# Patient Record
Sex: Male | Born: 1955 | Race: White | Hispanic: No | Marital: Married | Smoking: Never smoker
Health system: Southern US, Community
[De-identification: ages and names within clinical notes are randomized; demographics above are authoritative.]

## PROBLEM LIST (undated history)

## (undated) DIAGNOSIS — K2970 Gastritis, unspecified, without bleeding: Secondary | ICD-10-CM

## (undated) DIAGNOSIS — K219 Gastro-esophageal reflux disease without esophagitis: Secondary | ICD-10-CM

## (undated) DIAGNOSIS — C4491 Basal cell carcinoma of skin, unspecified: Secondary | ICD-10-CM

## (undated) DIAGNOSIS — N182 Chronic kidney disease, stage 2 (mild): Secondary | ICD-10-CM

## (undated) DIAGNOSIS — R945 Abnormal results of liver function studies: Secondary | ICD-10-CM

## (undated) DIAGNOSIS — R7989 Other specified abnormal findings of blood chemistry: Secondary | ICD-10-CM

## (undated) DIAGNOSIS — R030 Elevated blood-pressure reading, without diagnosis of hypertension: Secondary | ICD-10-CM

## (undated) DIAGNOSIS — E785 Hyperlipidemia, unspecified: Secondary | ICD-10-CM

## (undated) DIAGNOSIS — H9190 Unspecified hearing loss, unspecified ear: Secondary | ICD-10-CM

## (undated) HISTORY — DX: Abnormal results of liver function studies: R94.5

## (undated) HISTORY — DX: Elevated blood-pressure reading, without diagnosis of hypertension: R03.0

## (undated) HISTORY — DX: Basal cell carcinoma of skin, unspecified: C44.91

## (undated) HISTORY — DX: Gastro-esophageal reflux disease without esophagitis: K21.9

## (undated) HISTORY — DX: Unspecified hearing loss, unspecified ear: H91.90

## (undated) HISTORY — DX: Chronic kidney disease, stage 2 (mild): N18.2

## (undated) HISTORY — DX: Other specified abnormal findings of blood chemistry: R79.89

## (undated) HISTORY — DX: Hyperlipidemia, unspecified: E78.5

## (undated) HISTORY — PX: CYST REMOVAL TRUNK: SHX6283

## (undated) HISTORY — DX: Gastritis, unspecified, without bleeding: K29.70

---

## 2008-01-07 ENCOUNTER — Ambulatory Visit (HOSPITAL_COMMUNITY): Admission: RE | Admit: 2008-01-07 | Discharge: 2008-01-07 | Payer: Self-pay | Admitting: Cardiology

## 2013-05-27 ENCOUNTER — Ambulatory Visit: Payer: Self-pay | Admitting: Sports Medicine

## 2013-06-07 ENCOUNTER — Ambulatory Visit (INDEPENDENT_AMBULATORY_CARE_PROVIDER_SITE_OTHER): Payer: Managed Care, Other (non HMO) | Admitting: Sports Medicine

## 2013-06-07 ENCOUNTER — Encounter: Payer: Self-pay | Admitting: Sports Medicine

## 2013-06-07 VITALS — BP 163/104 | Ht 73.0 in | Wt 178.0 lb

## 2013-06-07 DIAGNOSIS — M6789 Other specified disorders of synovium and tendon, multiple sites: Secondary | ICD-10-CM

## 2013-06-07 DIAGNOSIS — M7660 Achilles tendinitis, unspecified leg: Secondary | ICD-10-CM | POA: Insufficient documentation

## 2013-06-07 MED ORDER — NITROGLYCERIN 0.2 MG/HR TD PT24: MEDICATED_PATCH | TRANSDERMAL | Status: DC

## 2013-06-07 NOTE — Progress Notes (Signed)
Subjective:   CC: New patient here to discuss right achilles pain.  HPI:   Patient is an 58 y.o. male with no significant PMH here to discuss right achilles pain. He is a runner and pain started suddenly while running ~2 years ago. He denies trauma or other inciting event, besides switching to minimalist shoes just prior. He rested for 3-4 weeks and resumed running, with subsequent return of pain. Pain was aggravated by running on hard surface. He denies swelling, redness, fevers, or chills. He has worked with a Physiological scientist doing calf raises. He also switched back to shoes with slight heel raise and that initially helped some. He currently reports intermittent dull achy pain while running, worse with going up hills, running on hard surfaces, and first thing in the morning going up/down stairs with stiffness. Has previously tried NTG quarter patch which helped with some of the pain.  Review of Systems - Per HPI.  PMH: No significant PMH Meds: Aspirin 81mg , fish oil  FH: Heart disease in multiple family members  SH: Runner    Objective:  Physical Exam Ht 6\' 1"  (1.854 m)  Wt 178 lb (80.74 kg)  BMI 23.49 kg/m2 GEN: NAD HEENT: Atraumatic, normocephalic, neck supple, EOMI, sclera clear  PULM: normal effort SKIN: No rash or cyanosis; warm and well-perfused EXTR: No lower extremity edema or calf tenderness; Right achilles tendon midsubstance thickening and tenderness to palpation. Mild decreased dorsiflexion compared to left. No joint swelling/effusion or erythema. Ultrasound long and short views performed;  Thickening in midsubstance of achilles tendon 0.73 cm. Hypoechoic changes along tendon consistent with fluid. No tear visualized. Normal gait. PSYCH: Mood and affect euthymic, normal rate and volume of speech NEURO: Awake, alert, no focal deficits grossly, normal speech  Assessment:     Daniel Griffith is a 58 y.o. male here to discuss right achilles pain.    Plan:      Right achilles pain By history and exam, most consistent with right achilles tendonopathy with tenderness and thickening of midsubstance of achilles tendon. On [place], no tear seen. No signs of infection. - Nitroglycerin protocol reviewed with patient. - Eccentric exercises prescribed and reviewed with patient. - Heel lift provided and recommended avoiding running on hard or irregular surfaces. Use pain as guide. - Return in 6 weeks for follow up and repeat US.   Hilton Sinclair, MD [place]

## 2013-06-07 NOTE — Patient Instructions (Signed)

## 2013-06-07 NOTE — Assessment & Plan Note (Addendum)
By history and exam, most consistent with right achilles tendonopathy with tenderness and thickening of midsubstance of achilles tendon. On [place], no tear seen. No signs of infection. - Nitroglycerin protocol reviewed with patient. - Eccentric exercises prescribed and reviewed with patient. - Heel lift provided and recommended avoiding running on hard or irregular surfaces. Use pain as guide. - Return in 6 weeks for follow up and repeat US.

## 2013-07-19 ENCOUNTER — Ambulatory Visit: Payer: Managed Care, Other (non HMO) | Admitting: Sports Medicine

## 2013-08-16 ENCOUNTER — Ambulatory Visit (INDEPENDENT_AMBULATORY_CARE_PROVIDER_SITE_OTHER): Payer: Managed Care, Other (non HMO) | Admitting: Sports Medicine

## 2013-08-16 ENCOUNTER — Encounter: Payer: Self-pay | Admitting: Sports Medicine

## 2013-08-16 ENCOUNTER — Ambulatory Visit
Admission: RE | Admit: 2013-08-16 | Discharge: 2013-08-16 | Disposition: A | Discharge status: Home / Self Care | Payer: Managed Care, Other (non HMO) | Source: Ambulatory Visit | Attending: Sports Medicine | Admitting: Sports Medicine

## 2013-08-16 VITALS — BP 139/90 | Ht 73.0 in | Wt 178.0 lb

## 2013-08-16 DIAGNOSIS — M25512 Pain in left shoulder: Secondary | ICD-10-CM

## 2013-08-16 DIAGNOSIS — M25519 Pain in unspecified shoulder: Secondary | ICD-10-CM

## 2013-08-16 NOTE — Progress Notes (Signed)
   Subjective:    Patient ID: Daniel Griffith, male    DOB: [birth year], 58 y.o.   MRN: [id number]  HPI Mr. Robson presents to clinic with left shoulder pain. His shoulder pain began about 3 months ago at the end of April. He was in a [employer] race, fell, and landed on the anterior part of his left shoulder. A week after the fall, he began noticing some stiffness and pain with movement of the left shoulder. The pain has gotten progressively worse, with pain waking him up at night when he turns and lays on the shoulder. He also had to modify his normal work out regimen since he has pain and/or popping sensation with certain movements (e.g. Overhead weight lifting or pushups). He has not had any past shoulder injuries or surgeries. He has not taken any medications for the pain. He denies neck pain. No numbness or tingling.   Review of Systems     Objective:   Physical Exam Gen: well-dressed, well nourished man sitting comfortably on exam table MSK: shoulders symmetric, no visible deformities or skin changes. Mild pain to palpation over anterior left humeral head. Decreased range of motion with abduction past 90 degrees on left side, otherwise all other range of motion are intact. Shoulder strength is intact bilaterally. Negative O'Brien's on left. Mildly positive empty can. Neurovascularly intact distally.     Assessment & Plan:  **Left shoulder pain: likely rotator cuff involvement - plain film at [facility] imaging - follow up 1 week to review x-rays and perform left shoulder U/S - if imaging shows mild rotator cuff injury, consider steroid injection and cuff exercises. If imaging shows a more involved cuff injury, will consider MRI.  Written by: Ivan Anchors, MS4

## 2013-08-24 ENCOUNTER — Other Ambulatory Visit: Payer: Managed Care, Other (non HMO) | Admitting: Sports Medicine

## 2013-08-24 ENCOUNTER — Encounter: Payer: Self-pay | Admitting: Sports Medicine

## 2013-08-24 ENCOUNTER — Ambulatory Visit (INDEPENDENT_AMBULATORY_CARE_PROVIDER_SITE_OTHER): Payer: Managed Care, Other (non HMO) | Admitting: Sports Medicine

## 2013-08-24 ENCOUNTER — Ambulatory Visit: Payer: Managed Care, Other (non HMO) | Admitting: Sports Medicine

## 2013-08-24 VITALS — BP 146/94 | HR 59 | Ht 73.0 in | Wt 178.0 lb

## 2013-08-24 DIAGNOSIS — M19212 Secondary osteoarthritis, left shoulder: Secondary | ICD-10-CM

## 2013-08-24 DIAGNOSIS — M19219 Secondary osteoarthritis, unspecified shoulder: Secondary | ICD-10-CM

## 2013-08-24 NOTE — Progress Notes (Signed)
   Subjective:    Patient ID: Daniel Griffith, male    DOB: September 02, 1955, 58 y.o.   MRN: [id number]  HPI Patient comes in today for an ultrasound of his left shoulder. Please see his last office note for details regarding history for this left shoulder injury. He continues to have persistent lateral shoulder pain particularly with reaching away from his body or reaching overhead. Still getting pain at night with sleeping on his left shoulder as well. Symptoms all began after he suffered an injury during a mud run.    Review of Systems     Objective:   Physical Exam Well-developed, well-nourished. No acute distress  Left shoulder: Full forward flexion. Abduction is to 160. Positive painful ARC. Pain with internal rotation. Positive empty can, positive Hawkins. Supraspinatus testing is 4/5 compared to 5/5 on the right. Resisted external rotation is 5/5 as is resisted internal rotation. No tenderness over the bicipital groove. Negative O'Briens. No tenderness over the a.c. joint. Neurovascularly intact distally.  X-rays of his left shoulder from 08/16/2013 are reviewed. Nothing acute is seen. Mild arthritic changes at the a.c. Joint  MSK ultrasound left shoulder was performed today. Biceps tendon was well visualized and appears to be within normal limits. Subscapularis and infraspinatus tendons also appear to be within normal limits. There is an area of hypoechoic change in the anterior supraspinatus tendon which is concerning for possible tear here. A.c. joint shows only mild degenerative changes.       Assessment & Plan:  Persistent left shoulder pain worrisome for rotator cuff   Given the traumatic nature of his injury, physical exam findings, and worrisome findings on ultrasound I want to get an MRI scan of his left shoulder to rule out a potential full-thickness rotator cuff tear which may require surgery. I will followup with him via telephone with those results once available at which  point we will delineate further treatment.

## 2013-08-28 ENCOUNTER — Ambulatory Visit
Admission: RE | Admit: 2013-08-28 | Discharge: 2013-08-28 | Disposition: A | Discharge status: Home / Self Care | Payer: Managed Care, Other (non HMO) | Source: Ambulatory Visit | Attending: Sports Medicine | Admitting: Sports Medicine

## 2013-08-28 DIAGNOSIS — M19212 Secondary osteoarthritis, left shoulder: Secondary | ICD-10-CM

## 2013-09-01 ENCOUNTER — Other Ambulatory Visit: Payer: Self-pay | Admitting: *Deleted

## 2013-09-01 ENCOUNTER — Telehealth: Payer: Self-pay | Admitting: Sports Medicine

## 2013-09-01 DIAGNOSIS — M25512 Pain in left shoulder: Secondary | ICD-10-CM

## 2013-09-01 NOTE — Telephone Encounter (Signed)
I spoke with the patient on the phone today regarding MRI findings of his left shoulder. The area of hypoechoic change in the supraspinatus tendon seen on his ultrasound corresponds with some tendinopathy seen on the MRI. In addition to this, he also has findings consistent with adhesive capsulitis as well as some mild glenohumeral degenerative changes. Based on these findings I have recommended a referral to [facility] imaging for interventional radiology to perform a combination glenohumeral/subacromial cortisone injection. Patient will followup with me in 3-4 weeks after the injection for education in a Jobe home exercise program. Patient is in agreement with this plan.

## 2013-09-02 ENCOUNTER — Other Ambulatory Visit: Payer: Self-pay | Admitting: Sports Medicine

## 2013-09-02 DIAGNOSIS — M25512 Pain in left shoulder: Secondary | ICD-10-CM

## 2013-09-14 ENCOUNTER — Ambulatory Visit
Admission: RE | Admit: 2013-09-14 | Discharge: 2013-09-14 | Disposition: A | Discharge status: Home / Self Care | Payer: Managed Care, Other (non HMO) | Source: Ambulatory Visit | Attending: Sports Medicine | Admitting: Sports Medicine

## 2013-09-14 DIAGNOSIS — M25512 Pain in left shoulder: Secondary | ICD-10-CM

## 2013-09-14 MED ORDER — METHYLPREDNISOLONE ACETATE 40 MG/ML INJ SUSP (RADIOLOG
120.0000 mg | Freq: Once | INTRAMUSCULAR | Status: AC
  Administered 2013-09-14: 120 mg via INTRA_ARTICULAR

## 2013-09-14 MED ORDER — IOHEXOL 180 MG/ML  SOLN
1.0000 mL | Freq: Once | INTRAMUSCULAR | Status: AC | PRN
  Administered 2013-09-14: 1 mL via INTRA_ARTICULAR

## 2013-09-17 ENCOUNTER — Other Ambulatory Visit: Payer: Self-pay | Admitting: Sports Medicine

## 2013-09-17 DIAGNOSIS — M25512 Pain in left shoulder: Secondary | ICD-10-CM

## 2013-11-03 ENCOUNTER — Ambulatory Visit
Admission: RE | Admit: 2013-11-03 | Discharge: 2013-11-03 | Disposition: A | Discharge status: Home / Self Care | Payer: Managed Care, Other (non HMO) | Source: Ambulatory Visit | Attending: Sports Medicine | Admitting: Sports Medicine

## 2013-11-03 ENCOUNTER — Encounter: Payer: Self-pay | Admitting: Sports Medicine

## 2013-11-03 ENCOUNTER — Ambulatory Visit (INDEPENDENT_AMBULATORY_CARE_PROVIDER_SITE_OTHER): Payer: Managed Care, Other (non HMO) | Admitting: Sports Medicine

## 2013-11-03 VITALS — BP 152/96 | Ht 73.0 in | Wt 180.0 lb

## 2013-11-03 DIAGNOSIS — M5442 Lumbago with sciatica, left side: Secondary | ICD-10-CM

## 2013-11-03 MED ORDER — PREDNISONE 50 MG PO TABS: 50.0000 mg | ORAL_TABLET | Freq: Every day | ORAL | Status: DC

## 2013-11-03 NOTE — Progress Notes (Signed)
  Daniel Griffith - 58 y.o. male MRN [id number]  Date of birth: 04-Sep-1955  CC & HPI:  The patient presents for evaluation of: Mid low back pain: Patient reports 8 weeks of mid low back pain that occasionally radiates down the posterior aspect of his left leg into his calf. Never to his toes. He reports this began following lifting weights and did better for 1-2 weeks and flared up again after resuming normal activity. His pain is centrally located and radiates down the left buttock. It is worse with prolonged standing or sitting. It is better with activity and he was asymptomatic on a 30 minute run most recently. His pain level is rated at 2 or 3 out of 10. He has been taking anti-inflammatories intermittently and gets some relief with this but not significant. He does have nighttime awakenings that began over the past 2-3 weeks. Denies fevers, chills, changes in bowel or bladder and denies saddle anesthesia.  ROS:  Per HPI.   HISTORY: Past Medical, Surgical, Social, and Family History Reviewed & Updated per EMR.  Pertinent Historical Findings include: Otherwise relatively healthy. No chronic medications. Prior shoulder issues are improved. Works in [employer].   OBJECTIVE FINDINGS:  VS:   HT:6\' 1"  (185.4 cm)   WT:180 lb (81.647 kg)  BMI:23.8          BP:152/96 mmHg  HR: bpm  TEMP: ( )  RESP:   PHYSICAL EXAM: GENERAL:  adult Caucasian athletic male. In no discomfort; no respiratory distress   PSYCH: alert and appropriate, good insight   NEURO: Sensation is intact to light touch in bilateral lower extremity dermatomes. Lower extremity DTRs 1+ out of 4 diffusely   VASCULAR:  bilateral PT pulses 2+/4.  No significant edema.   Back EXAM: Appearance:  overall normal-appearing, no significant scoliosis. Neutral leg length at rest. With sitting pseudo-short left leg.   Skin: No overlying erythema/ecchymosis.  Palpation: TTP over: Left gluteals minimally  No TTP over: Bilateral  greater trochanters, left piriformis.   Strength & ROM: 5/5 Strength and full active ROM in: Bilateral lower extremity myotomes  He is able to heel and toe walk without difficulty  Special Tests:  positive left straight leg raise radiating to the lateral malleolus. Slightly positive contralateral straight leg raise.  Neutral ASIS and open SI joints to ASIS compression test.    ASSESSMENT: 1. 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 - Lumbar spine X-ray to eval for structural etiology given >8 weeks. - Prednisone burst - HEP: back handout to progress as tolerated > Return in about 6 weeks (around 12/15/2013), or if symptoms worsen or fail to improve.

## 2013-11-17 ENCOUNTER — Telehealth: Payer: Self-pay | Admitting: Sports Medicine

## 2013-11-17 NOTE — Telephone Encounter (Signed)
I spoke with the patient today. He is continuing to suffer with left leg radiculopathy despite a recent steroid dose pack. Therefore I will have him work with physical therapist Barbaraann Barthel and he will let me know if symptoms persist afterwards. If so, I would consider an MRI of his lumbar spine to evaluate degree of lumbar disc pathology present.

## 2014-06-02 ENCOUNTER — Encounter: Payer: Self-pay | Admitting: Sports Medicine

## 2014-06-02 ENCOUNTER — Ambulatory Visit (INDEPENDENT_AMBULATORY_CARE_PROVIDER_SITE_OTHER): Payer: Managed Care, Other (non HMO) | Admitting: Sports Medicine

## 2014-06-02 VITALS — BP 151/93 | Ht 73.0 in | Wt 175.0 lb

## 2014-06-02 DIAGNOSIS — M67879 Other specified disorders of synovium and tendon, unspecified ankle and foot: Secondary | ICD-10-CM

## 2014-06-02 DIAGNOSIS — M7660 Achilles tendinitis, unspecified leg: Secondary | ICD-10-CM

## 2014-06-02 MED ORDER — NITROGLYCERIN 0.2 MG/HR TD PT24: MEDICATED_PATCH | TRANSDERMAL | Status: DC

## 2014-06-02 NOTE — Progress Notes (Signed)
  Daniel Griffith - 59 y.o. male MRN [id number]  Date of birth: 09-09-1955  SUBJECTIVE: Including CC, HPI, ROS HISTORY:  Chief complaint: left Achilles pain, reevaluation  HPI: patient reports greater than 6 months of worsening left Achilles pain.  He is previously been seen for this and prescribed therapeutic exercises to the short course of nitroglycerin a tolerated well but does not feel significant benefit.  He did have some minor side effects retoucher dispatch to have to patch.  Reports pain did somewhat improve it has significantly come back and is once again affecting his ability to run and participate in daily activities.  GQB:[id number] fevers, chills, recent weight gain or weight loss.  No significant nighttime awakenings due to this issue.  No night sweats.  No specialty comments available. Social History   Occupational History  . Not on file.   Social History Main Topics  . Smoking status: Never Smoker   . Smokeless tobacco: Not on file  . Alcohol Use: Not on file  . Drug Use: Not on file  . Sexual Activity: Not on file      Problem  Achilles Tendon Pain   History of right Achilles issues as well as left sciatic pain. Question contribution of potential neurogenic component contributing to prolonged healing. Was performing concentric/bounding with no significant eccentric component for therapeutic exercise     OBJECTIVE: HT:6\' 1"  (185.4 cm) WT:175 lb (79.379 kg) BMI:23.1 BP:(!) 151/93 mmHg HR: bpm TEMP: ( ) RESP:  PHYSICAL EXAM: GENERAL: adult Caucasian male. No acute distress PSYCH: Alert and appropriately interactive. SKIN: No open skin lesions or abnormal skin markings on areas inspected as below VASCULAR: Distal pulse is intact.  No significant extremity edema. NEURO: Lower extremity strength is 5+/5 in all myotomes; sensation is intact to light touch in all dermatomes. RIGHT ANKLE: overall well line, no significant malalignment.  He does have a significant  prominence of the right greater than left Achilles upon palpation.  This is not fluctuate.  No surrounding erythema.  Marked tenderness over the area of swelling.  No significant archer calcaneal swelling.  No significant insertional heel pain with either the Achilles or the plantar fascia.  Dorsiflexion to 105.  Bilaterally.  Ankle is stable. Marland Kitchen    DATA REVIEWED & OBTAINED:  Limited MSK [place] right ankle: findings markedly thick Achilles tendon with marked increased neovascularity measuring aproximately 0.9 cm.  Left is measuring 0.6 to 0.7 cm although there's no increased neovascularity there. 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

## 2014-06-02 NOTE — Assessment & Plan Note (Addendum)
1. Reemphasized importance of appropriate Alfredson exercises. Focus on eccentric phase. 2. Body Helix right ankle compression sleeve provided today. Discussed using during activity as well as for the first 2 hours following activity and PRN. 3. Ice PRN 4. Avoid ballistic activities, discussed potential for tendon rupture

## 2014-06-02 NOTE — Patient Instructions (Signed)

## 2014-10-26 ENCOUNTER — Ambulatory Visit (INDEPENDENT_AMBULATORY_CARE_PROVIDER_SITE_OTHER): Payer: Managed Care, Other (non HMO) | Admitting: Family Medicine

## 2014-10-26 ENCOUNTER — Encounter: Payer: Self-pay | Admitting: Family Medicine

## 2014-10-26 VITALS — BP 140/66 | HR 57 | Ht 73.0 in | Wt 178.0 lb

## 2014-10-26 DIAGNOSIS — S83241A Other tear of medial meniscus, current injury, right knee, initial encounter: Secondary | ICD-10-CM | POA: Diagnosis not present

## 2014-10-26 NOTE — Progress Notes (Signed)
  Daniel Griffith - 59 y.o. male MRN [id number]  Date of birth: 1955/12/14  SUBJECTIVE:  Including CC & ROS.  Daniel Griffith is a 59 y.o. male who presents today for right knee pain.    Knee Pain right - patient presents today for 5 days of right knee pain. He states that he was in the batting cage at that time at a slight twisting injury when he had some immediate pain in the right knee. Located in the inner aspect and states that it had a little bit of swelling the next day when he woke up. This gradually improved but is worse when he has deep flexion or with running. Has tried ice and ibuprofen which has helped somewhat. No previous injury to the left of the right knee.  Past medical family social history reviewed and updated.  12 point ROS negative other than per HPI.   Exam:  Filed Vitals:   10/26/14 1409  BP: 140/66  Pulse: 57    Gen: NAD Cardiorespiratory - Normal respiratory effort/rate.  RRR R Knee:  Normal to inspection with no erythema or effusion or obvious bony abnormalities.  No obvious Baker's cysts Slight tenderness to palpation along the medial joint line ROM normal in flexion (135 degrees) and extension (0 degrees) and lower leg rotation. Ligaments with solid consistent endpoints including ACL, PCL, LCL, MCL.  Negative Anterior Drawer/Lachman/Pivot Shift + Mcmurray's and provocative meniscal tests including Thessaly and Apley compression testing  Non painful patellar compression.  Normal Patellar glide.  No apprehension  Patellar and quadriceps tendons unremarkable. Hamstring and quadriceps strength is normal.  Neurovascularly intact B/L LE   Imaging:  None today

## 2014-10-26 NOTE — Assessment & Plan Note (Signed)
Patient with classic description of meniscus tear of the medial meniscus of the right knee. -Minimal swelling, pain only with deep flexion, pain only at terminal McMurray's, and gradual swelling initially. -Conservative management is what he would like to try first with ice, decreasing flexion exercises/squats, ibuprofen, quad and hamstring strengthening and avoiding aggravating activities. -If it is still bugging him in 3-4 weeks he will call which point we will consider injection versus imaging including knee x-rays/MRI.

## 2015-06-30 ENCOUNTER — Other Ambulatory Visit: Payer: Self-pay | Admitting: Family Medicine

## 2015-06-30 DIAGNOSIS — R740 Nonspecific elevation of levels of transaminase and lactic acid dehydrogenase [LDH]: Principal | ICD-10-CM

## 2015-06-30 DIAGNOSIS — R7401 Elevation of levels of liver transaminase levels: Secondary | ICD-10-CM

## 2015-06-30 DIAGNOSIS — R1084 Generalized abdominal pain: Secondary | ICD-10-CM

## 2015-07-07 ENCOUNTER — Ambulatory Visit
Admission: RE | Admit: 2015-07-07 | Discharge: 2015-07-07 | Disposition: A | Discharge status: Home / Self Care | Payer: Managed Care, Other (non HMO) | Source: Ambulatory Visit | Attending: Family Medicine | Admitting: Family Medicine

## 2015-07-07 DIAGNOSIS — R740 Nonspecific elevation of levels of transaminase and lactic acid dehydrogenase [LDH]: Principal | ICD-10-CM

## 2015-07-07 DIAGNOSIS — R1084 Generalized abdominal pain: Secondary | ICD-10-CM

## 2015-07-07 DIAGNOSIS — R7401 Elevation of levels of liver transaminase levels: Secondary | ICD-10-CM

## 2015-07-07 MED ORDER — IOPAMIDOL (ISOVUE-300) INJECTION 61%
100.0000 mL | Freq: Once | INTRAVENOUS | Status: AC | PRN
  Administered 2015-07-07: 100 mL via INTRAVENOUS

## 2015-09-21 ENCOUNTER — Ambulatory Visit
Admission: RE | Admit: 2015-09-21 | Discharge: 2015-09-21 | Disposition: A | Discharge status: Home / Self Care | Payer: Managed Care, Other (non HMO) | Source: Ambulatory Visit | Attending: Physician Assistant | Admitting: Physician Assistant

## 2015-09-21 ENCOUNTER — Other Ambulatory Visit: Payer: Self-pay | Admitting: Physician Assistant

## 2015-09-21 DIAGNOSIS — L989 Disorder of the skin and subcutaneous tissue, unspecified: Secondary | ICD-10-CM

## 2015-09-27 ENCOUNTER — Other Ambulatory Visit: Payer: Self-pay | Admitting: Physician Assistant

## 2015-09-28 ENCOUNTER — Other Ambulatory Visit: Payer: Self-pay | Admitting: Physician Assistant

## 2015-09-28 ENCOUNTER — Other Ambulatory Visit: Payer: Self-pay | Admitting: General Surgery

## 2015-09-28 DIAGNOSIS — M7989 Other specified soft tissue disorders: Secondary | ICD-10-CM

## 2015-10-05 ENCOUNTER — Other Ambulatory Visit: Payer: Managed Care, Other (non HMO)

## 2015-10-12 ENCOUNTER — Other Ambulatory Visit: Payer: Managed Care, Other (non HMO)

## 2016-07-12 IMAGING — CT CT ABD-PELV W/ CM
1 of 3 series · 14 of 32 positions shown, 19 images · IV contrast (APPLIED)
Comparison: None.

CLINICAL DATA: RIGHT upper quadrant pain extending into the RIGHT
flank.

EXAM:
CT ABDOMEN AND PELVIS WITH CONTRAST
TECHNIQUE: Multidetector CT imaging of the abdomen and pelvis was performed
using the standard protocol following bolus administration of
intravenous contrast.
CONTRAST:  100mL 02DOHP-DXX IOPAMIDOL (02DOHP-DXX) INJECTION 61%

[Series 2: abd/pelvis w/cm · axial · 0.79mm/px · z∈[+602,+1042]mm · 14 of 98 slices shown, 19 images]
[im 5/98  soft-tissue]
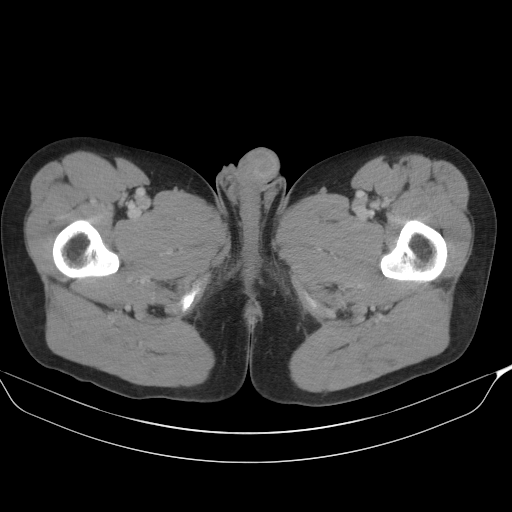
[im 5/98  bone]
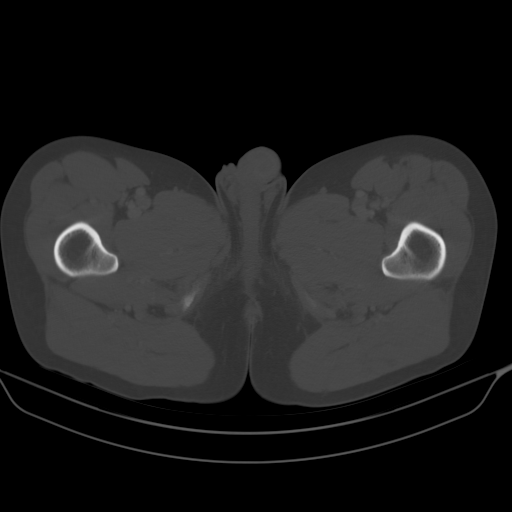
[im 15/98  soft-tissue]
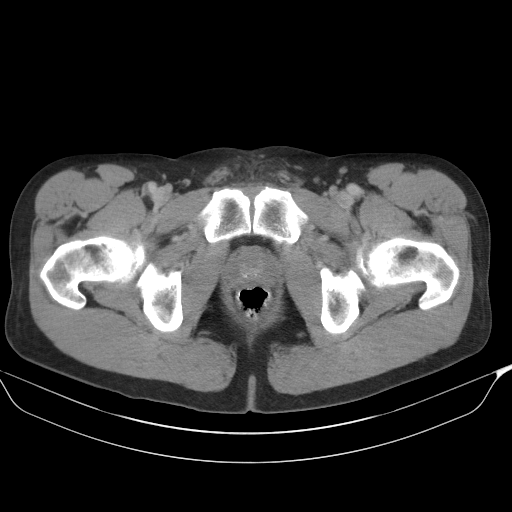
[im 20/98  soft-tissue]
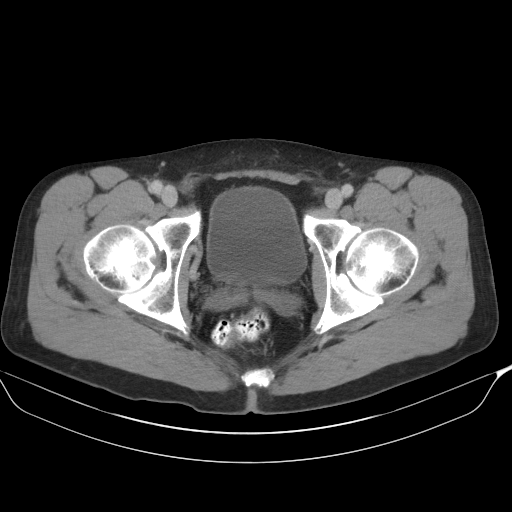
[im 30/98  soft-tissue]
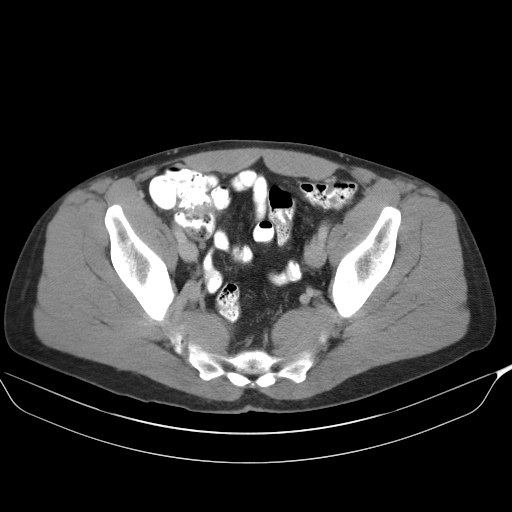
[im 34/98  soft-tissue]
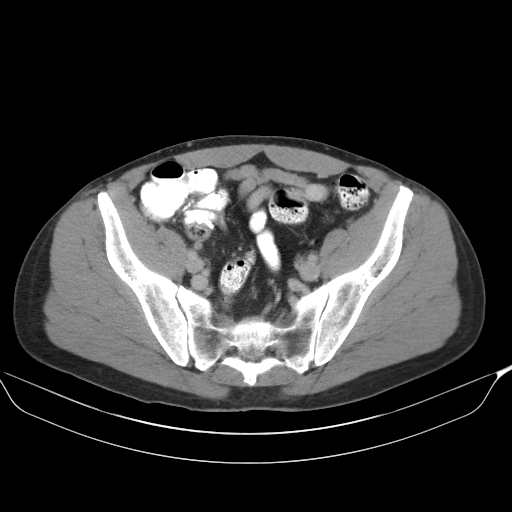
[im 44/98  soft-tissue]
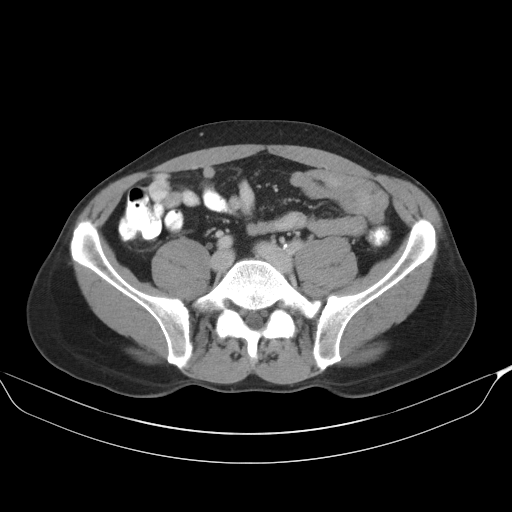
[im 49/98  soft-tissue]
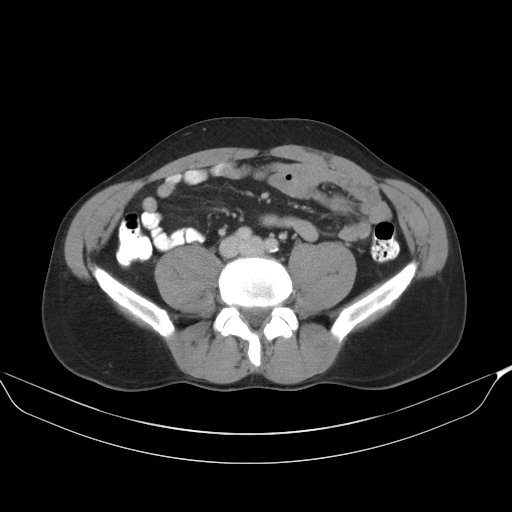
[im 54/98  soft-tissue]
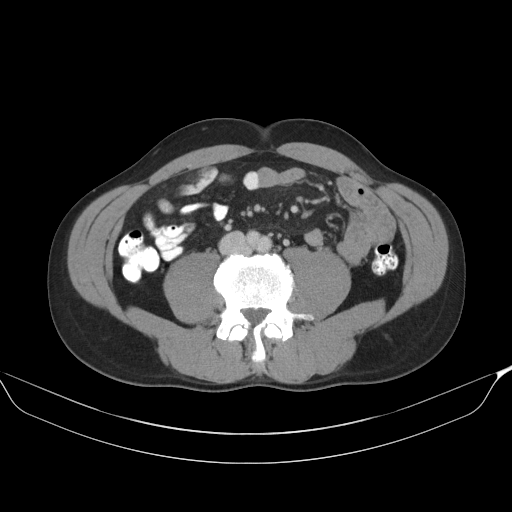
[im 64/98  soft-tissue]
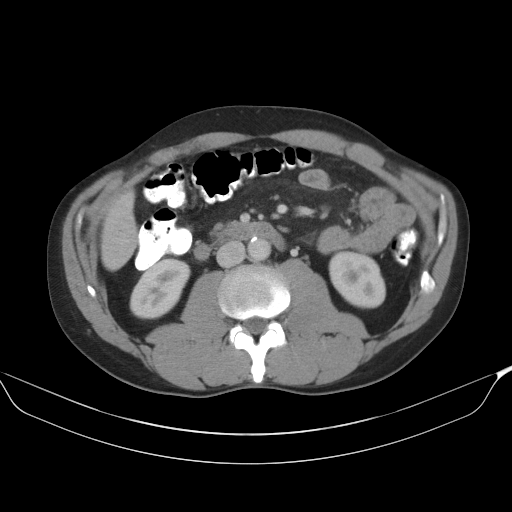
[im 64/98  bone]
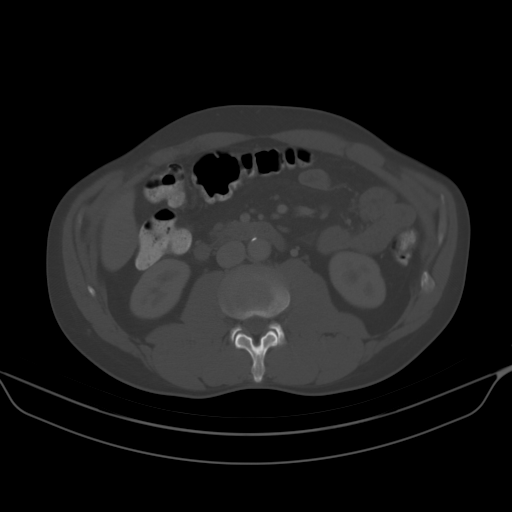
[im 68/98  soft-tissue]
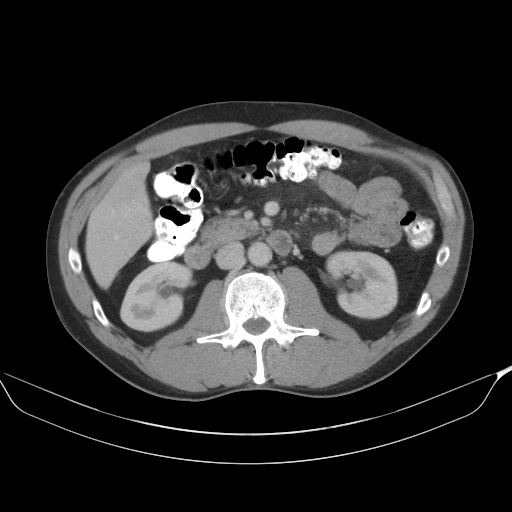
[im 78/98  soft-tissue]
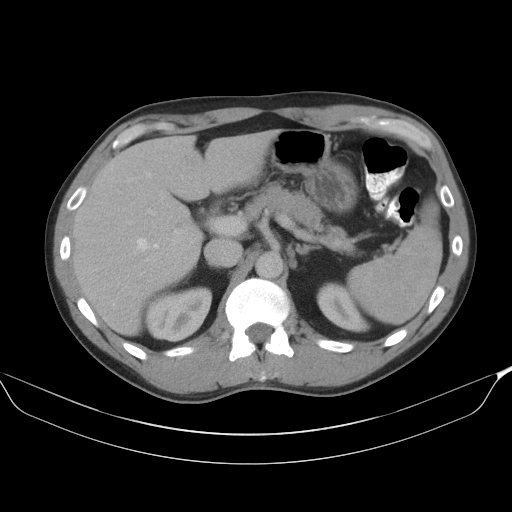
[im 78/98  lung]
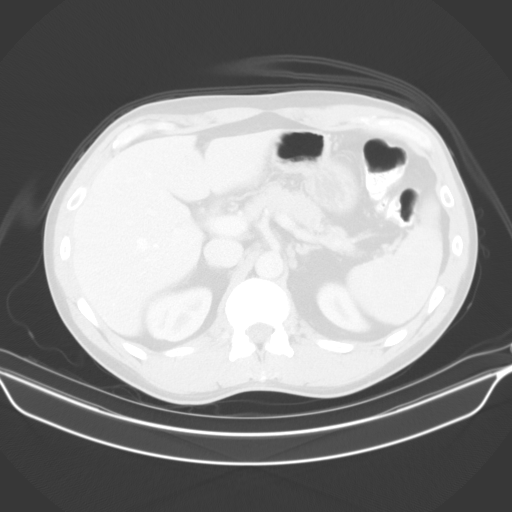
[im 83/98  soft-tissue]
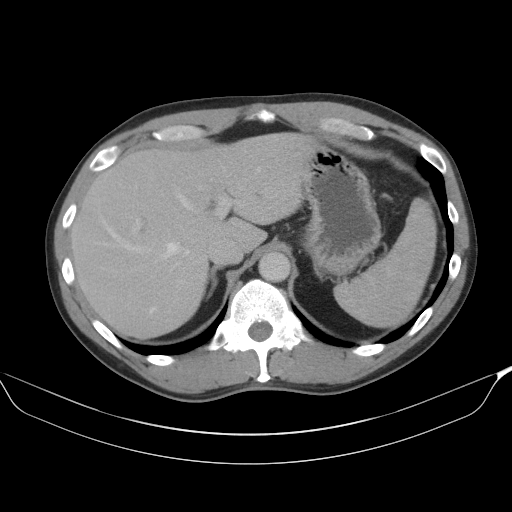
[im 83/98  lung]
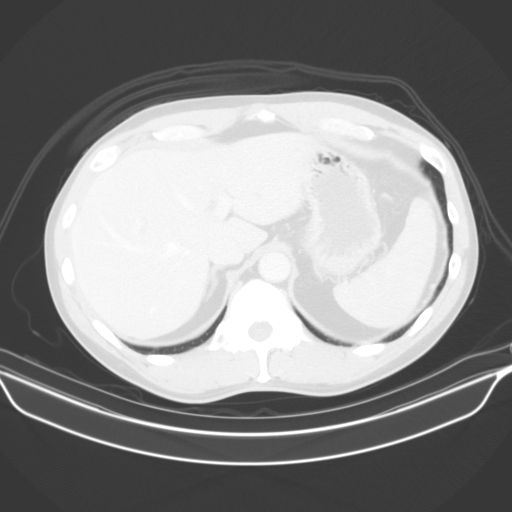
[im 88/98  lung]
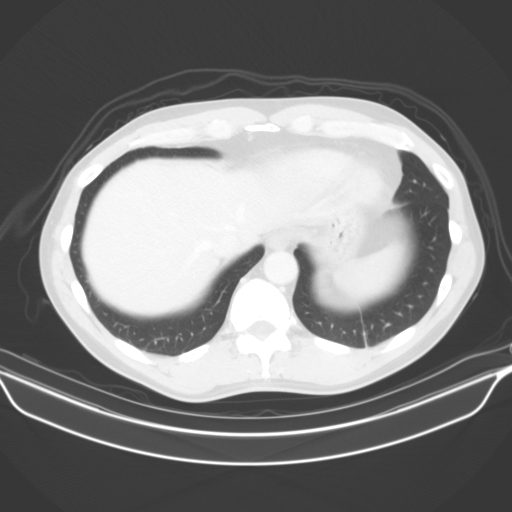
[im 93/98  soft-tissue]
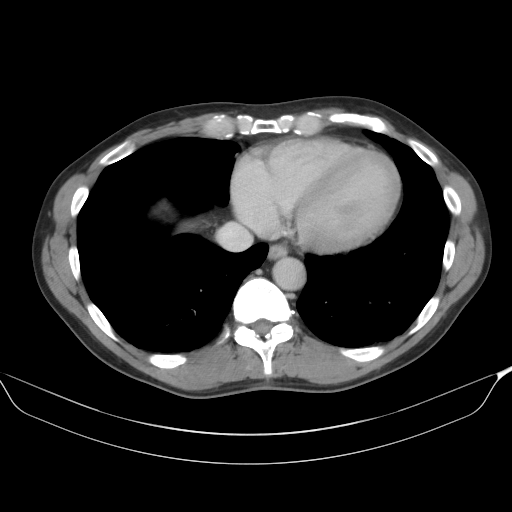
[im 93/98  lung]
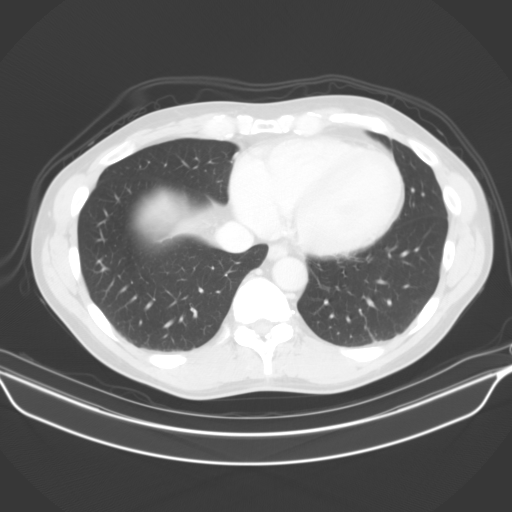

[14 of 32 positions shown; findings below may reference images not displayed]

FINDINGS: Lower chest: Lung bases are clear.

Hepatobiliary: No focal hepatic lesion. No biliary duct dilatation.
Gallbladder is normal. Common bile duct is normal.

Pancreas: Pancreas is normal. No ductal dilatation. No pancreatic
inflammation.

Spleen: Normal spleen

Adrenals/urinary tract: Adrenal glands and kidneys are normal. The
ureters and bladder normal.

Stomach/Bowel: Stomach, small bowel, appendix, and cecum are normal.
The colon and rectosigmoid colon are normal.

Vascular/Lymphatic: Abdominal aorta is normal caliber with
atherosclerotic calcification. There is no retroperitoneal or
periportal lymphadenopathy. No pelvic lymphadenopathy.

Reproductive: Number prostate gland

Other: No free fluid.

Musculoskeletal: No aggressive osseous lesion.
IMPRESSION: 1. No acute findings in the abdomen pelvis. No explanation RIGHT
flank pain.
2. Normal gallbladder and appendix.  No ureteral lithiasis.

## 2017-01-24 ENCOUNTER — Telehealth: Payer: Self-pay

## 2017-01-24 NOTE — Telephone Encounter (Signed)
SENT REFERRAL TO SCHEDULING 

## 2017-03-07 ENCOUNTER — Other Ambulatory Visit: Payer: Self-pay | Admitting: Family Medicine

## 2017-03-07 ENCOUNTER — Ambulatory Visit
Admission: RE | Admit: 2017-03-07 | Discharge: 2017-03-07 | Disposition: A | Discharge status: Home / Self Care | Payer: 59 | Source: Ambulatory Visit | Attending: Family Medicine | Admitting: Family Medicine

## 2017-03-07 DIAGNOSIS — S91302A Unspecified open wound, left foot, initial encounter: Secondary | ICD-10-CM

## 2017-03-10 ENCOUNTER — Other Ambulatory Visit: Payer: Self-pay

## 2017-03-10 DIAGNOSIS — C4491 Basal cell carcinoma of skin, unspecified: Secondary | ICD-10-CM | POA: Insufficient documentation

## 2017-03-17 ENCOUNTER — Ambulatory Visit (INDEPENDENT_AMBULATORY_CARE_PROVIDER_SITE_OTHER): Payer: 59 | Admitting: Cardiology

## 2017-03-17 ENCOUNTER — Encounter: Payer: Self-pay | Admitting: Cardiology

## 2017-03-17 VITALS — BP 142/80 | HR 64 | Ht 73.0 in | Wt 192.8 lb

## 2017-03-17 DIAGNOSIS — R0602 Shortness of breath: Secondary | ICD-10-CM | POA: Diagnosis not present

## 2017-03-17 DIAGNOSIS — I472 Ventricular tachycardia, unspecified: Secondary | ICD-10-CM

## 2017-03-17 DIAGNOSIS — Z8249 Family history of ischemic heart disease and other diseases of the circulatory system: Secondary | ICD-10-CM

## 2017-03-17 NOTE — Progress Notes (Signed)
Cardiology Office Note:    Date:  03/17/2017   ID:  Daniel Griffith, DOB [birth year], MRN [id number]  PCP:  Maury Dus, MD  Cardiologist:  No primary care provider on file.   Referring MD: Maury Dus, MD     History of Present Illness:    Daniel Griffith is a 62 y.o. male here for the evaluation of ventricular tachycardia.  This was discovered on an exercise treadmill test in 2009.  His father had CABG at age 44 brother had cardiac stenting at age 28.  His LDL cholesterol is 145, HDL 69, creatinine 1.  Report from/treadmill test 12/18/2007 shows exercising for 13-1/35minutes.  After 11 minutes of exercise he developed bigeminy.  This persisted and varied between the bigeminy and trigeminy finally at 13 minutes of exercise he developed nonsustained ventricular tachycardia anywhere from 4-7 beats.  In the rest phase this promptly resolved.  No ST segment changes.  No chest pain.  Options were discussed with him and Dr. Ilda Foil at that time did not feel his findings and his risk factors of nonsustained ventricular tachycardia could be disregarded.  He discussed options for noninvasive versus invasive further evaluation including risks of both.  After lengthy discussion he elected to proceed with cardiac catheterization and coronary angiography.  I do not have any records from the cardiac catheterization. This was negative per peatient.   When starting a run going heard or temperature change has increased SOB, slow down.  Inhairler seems to be working. No syncope.   Past Medical History:  Diagnosis Date  . BCC (basal cell carcinoma of skin)    right cheek  . Blood pressure elevated without history of HTN   . Chronic kidney disease, stage 2 (mild)   . Diminished hearing   . Elevated LFTs    no specific diagnosis  . Gastritis   . GERD (gastroesophageal reflux disease)   . Hyperlipidemia     Past Surgical History:  Procedure Laterality Date  . CYST REMOVAL TRUNK     removed from  mid and right anterior neck    Current Medications: Current Meds  Medication Sig  . aspirin 81 MG tablet Take 81 mg by mouth daily.  . clindamycin (CLEOCIN) 300 MG capsule Take 1 capsule by mouth 3 (three) times daily.     Allergies:   Patient has no known allergies.   Social History   Socioeconomic History  . Marital status: Married    Spouse name: None  . Number of children: 2  . Years of education: None  . Highest education level: None  Social Needs  . Financial resource strain: None  . Food insecurity - worry: None  . Food insecurity - inability: None  . Transportation needs - medical: None  . Transportation needs - non-medical: None  Occupational History  . None  Tobacco Use  . Smoking status: Never Smoker  . Smokeless tobacco: Never Used  Substance and Sexual Activity  . Alcohol use: Yes    Comment: drinks a couple of beers on the weekends  . Drug use: No  . Sexual activity: None  Other Topics Concern  . None  Social History Narrative  . None     Family History: The patient's family history includes Alzheimer's disease in his father; Breast cancer in his mother; CVA in his mother; Other in his brother and father; Prostate cancer in his brother; Skin cancer in his father.  ROS:   Please see the history of present illness.  All other systems reviewed and are negative.  EKGs/Labs/Other Studies Reviewed:    The following studies were reviewed today: Prior office notes, exercise treadmill test reviewed as above.  EKG reviewed  EKG:  EKG is  ordered today.  03/17/17-64, sinus rhythm, vertical axis.  Recent Labs: No results found for requested labs within last 8760 hours.  Recent Lipid Panel No results found for: CHOL, TRIG, HDL, CHOLHDL, VLDL, LDLCALC, LDLDIRECT   Total cholesterol 226, HDL 69, LDL 145, triglycerides 62 1, creatinine 1, potassium 5.1, ALT 25, TSH 1.4  Physical Exam:    VS:  BP (!) 142/80   Pulse 64   Ht 6\' 1"  (1.854 m)   Wt 192 lb  12.8 oz (87.5 kg)   BMI 25.44 kg/m     Wt Readings from Last 3 Encounters:  03/17/17 192 lb 12.8 oz (87.5 kg)  10/26/14 178 lb (80.7 kg)  06/02/14 175 lb (79.4 kg)     GEN:  Well nourished, well developed in no acute distress HEENT: Normal NECK: No JVD; No carotid bruits LYMPHATICS: No lymphadenopathy CARDIAC: RRR, no murmurs, rubs, gallops RESPIRATORY:  Clear to auscultation without rales, wheezing or rhonchi  ABDOMEN: Soft, non-tender, non-distended MUSCULOSKELETAL:  No edema; No deformity  SKIN: Warm and dry NEUROLOGIC:  Alert and oriented x 3 PSYCHIATRIC:  Normal affect   ASSESSMENT:    1. Shortness of breath   2. Family history of early CAD   3. Ventricular tachycardia (HCC)    PLAN:    In order of problems listed above:  Dyspnea/history of ventricular tachycardia -I will check an exercise treadmill test to see if we can duplicate his symptoms or previous findings.  10 years ago after treadmill test, he underwent cardiac catheterization and this was negative.  He seemed to have have some relief with new inhaler, perhaps there is some exercise-induced asthma that is present this is encouraging that he is feeling better with the new inhaler.  Let us get another exercise treadmill test to see if there is been any significant changes.  He does have a family history of CAD.  If necessary, we can pursue coronary CT scan to evaluate coronary arteries versus invasive cardiac catheterization.  We will follow-up with results.  I will check an echocardiogram as well to make sure structure and function is normal.   Medication Adjustments/Labs and Tests Ordered: Current medicines are reviewed at length with the patient today.  Concerns regarding medicines are outlined above.  Orders Placed This Encounter  Procedures  . Exercise Tolerance Test  . EKG 12-Lead  . ECHOCARDIOGRAM COMPLETE   No orders of the defined types were placed in this encounter.   Signed, Candee Furbish, MD    03/17/2017 9:42 AM    [facility]

## 2017-03-17 NOTE — Patient Instructions (Signed)
Medication Instructions:  The current medical regimen is effective;  continue present plan and medications.  Testing/Procedures: Your physician has requested that you have an echocardiogram. Echocardiography is a painless test that uses sound waves to create images of your heart. It provides your doctor with information about the size and shape of your heart and how well your heart's chambers and valves are working. This procedure takes approximately one hour. There are no restrictions for this procedure.  Your physician has requested that you have an exercise tolerance test. For further information please visit [email or web address]. Please also follow instruction sheet, as given.  Follow-Up: Follow up as needed after the above testing.   Thank you for choosing [facility]!!

## 2017-04-02 ENCOUNTER — Other Ambulatory Visit: Payer: Self-pay

## 2017-04-02 ENCOUNTER — Ambulatory Visit (HOSPITAL_COMMUNITY): Payer: 59 | Attending: Cardiology

## 2017-04-02 ENCOUNTER — Ambulatory Visit (INDEPENDENT_AMBULATORY_CARE_PROVIDER_SITE_OTHER): Payer: 59

## 2017-04-02 DIAGNOSIS — R0602 Shortness of breath: Secondary | ICD-10-CM

## 2017-04-02 LAB — EXERCISE TOLERANCE TEST
CHL CUP MPHR: 161 {beats}/min
CHL CUP RESTING HR STRESS: 63 {beats}/min
CHL RATE OF PERCEIVED EXERTION: 13
Estimated workload: 10.4 METS
Exercise duration (min): 9 min
Exercise duration (sec): 0 s
Peak HR: 137 {beats}/min
Percent HR: 85 %

## 2017-04-03 ENCOUNTER — Telehealth: Payer: Self-pay | Admitting: Cardiology

## 2017-04-03 NOTE — Telephone Encounter (Signed)
Follow Up:    Returning Pam Fleming's call from yesterday,concerning his Stress Test results.

## 2017-04-03 NOTE — Telephone Encounter (Signed)
Pt called back for ETT results. He verbalized understanding. He did have additional questions about his blood pressure and if he should start something for BP management. He stated he was going to call his PCP regarding this, but would still like for Dr Gust Brooms nurse to give him a call back to discuss this.

## 2017-04-04 ENCOUNTER — Other Ambulatory Visit: Payer: Self-pay | Admitting: *Deleted

## 2017-04-04 DIAGNOSIS — R0602 Shortness of breath: Secondary | ICD-10-CM

## 2017-04-04 DIAGNOSIS — R931 Abnormal findings on diagnostic imaging of heart and coronary circulation: Secondary | ICD-10-CM

## 2017-04-04 NOTE — Telephone Encounter (Signed)
Called and spoke with patient.  He did not have any questions r/t his results.  He does not remember any questions that he had.  He is contacting his PCP in r/t HTN.  He will c/b with any questions/concerns.

## 2017-04-04 NOTE — Progress Notes (Signed)
Notes recorded by Jerline Pain, MD on 04/02/2017 at 3:20 PM EDT Normal EF, mild diastolic dysfunction. Please have him come back in for a bubble study to exclude membranous VSD. I am not convinced that there is a VSD present.  Candee Furbish, MD

## 2017-04-07 ENCOUNTER — Telehealth: Payer: Self-pay | Admitting: Cardiology

## 2017-04-07 NOTE — Telephone Encounter (Signed)
Pt called back to office to question if OK to continue to exercise since he was noted to have hypertensive response to exercise.  Advised per Dr Marlou Porch, ok to continue to exercise.  Also advised he should f/u with his PCP for any necessary treatment r/t this.  He states understanding.  Also discussed the results of his 2 D Echo and need for further testing in the way of a bubble study to r/o possible VSD.  Explained the procedure to the patient and that a scheduler will be calling back to set up the appt.  He states understanding.

## 2017-04-07 NOTE — Telephone Encounter (Signed)
New Message   Patient is calling with additional questions about his stress test. Please call to discuss.

## 2017-04-08 ENCOUNTER — Encounter: Payer: Self-pay | Admitting: Cardiology

## 2017-04-21 ENCOUNTER — Other Ambulatory Visit (HOSPITAL_COMMUNITY): Payer: 59

## 2017-04-23 ENCOUNTER — Ambulatory Visit (HOSPITAL_COMMUNITY): Payer: 59 | Attending: Cardiology

## 2017-04-23 ENCOUNTER — Other Ambulatory Visit: Payer: Self-pay

## 2017-04-23 DIAGNOSIS — R931 Abnormal findings on diagnostic imaging of heart and coronary circulation: Secondary | ICD-10-CM | POA: Diagnosis not present

## 2017-04-23 DIAGNOSIS — R0602 Shortness of breath: Secondary | ICD-10-CM | POA: Diagnosis present

## 2018-03-13 IMAGING — CR DG FOOT 2V*L*
2 series · 2 of 2 positions shown · non-contrast
Comparison: No recent.

CLINICAL DATA: Left foot injury with wound.

EXAM:
LEFT FOOT - 2 VIEW

[x foot ap left]
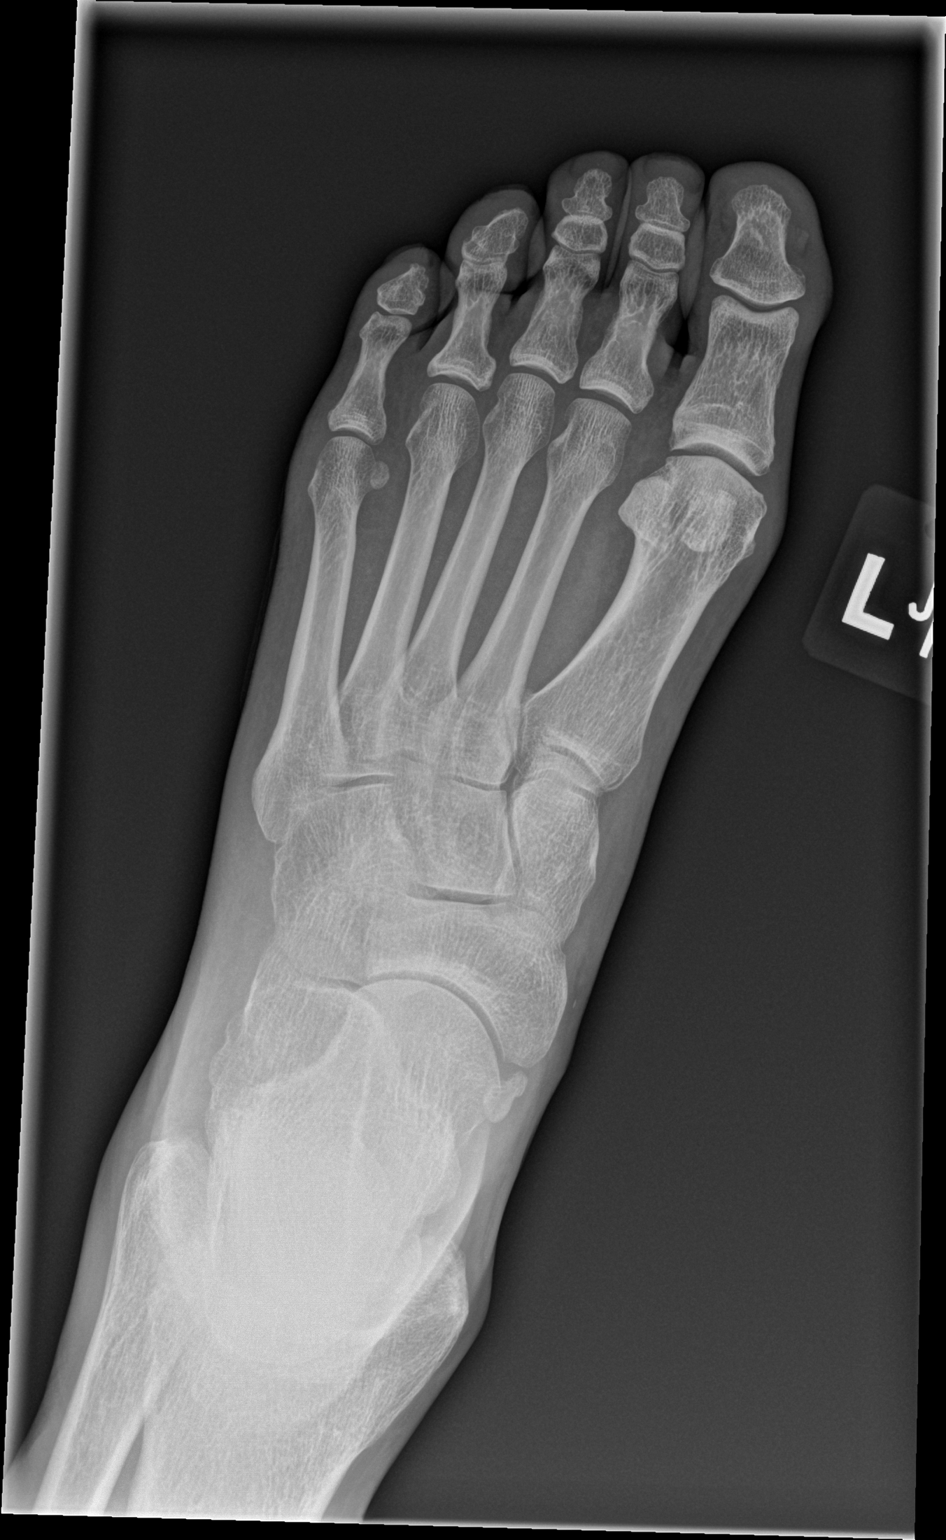

[x foot lat left]
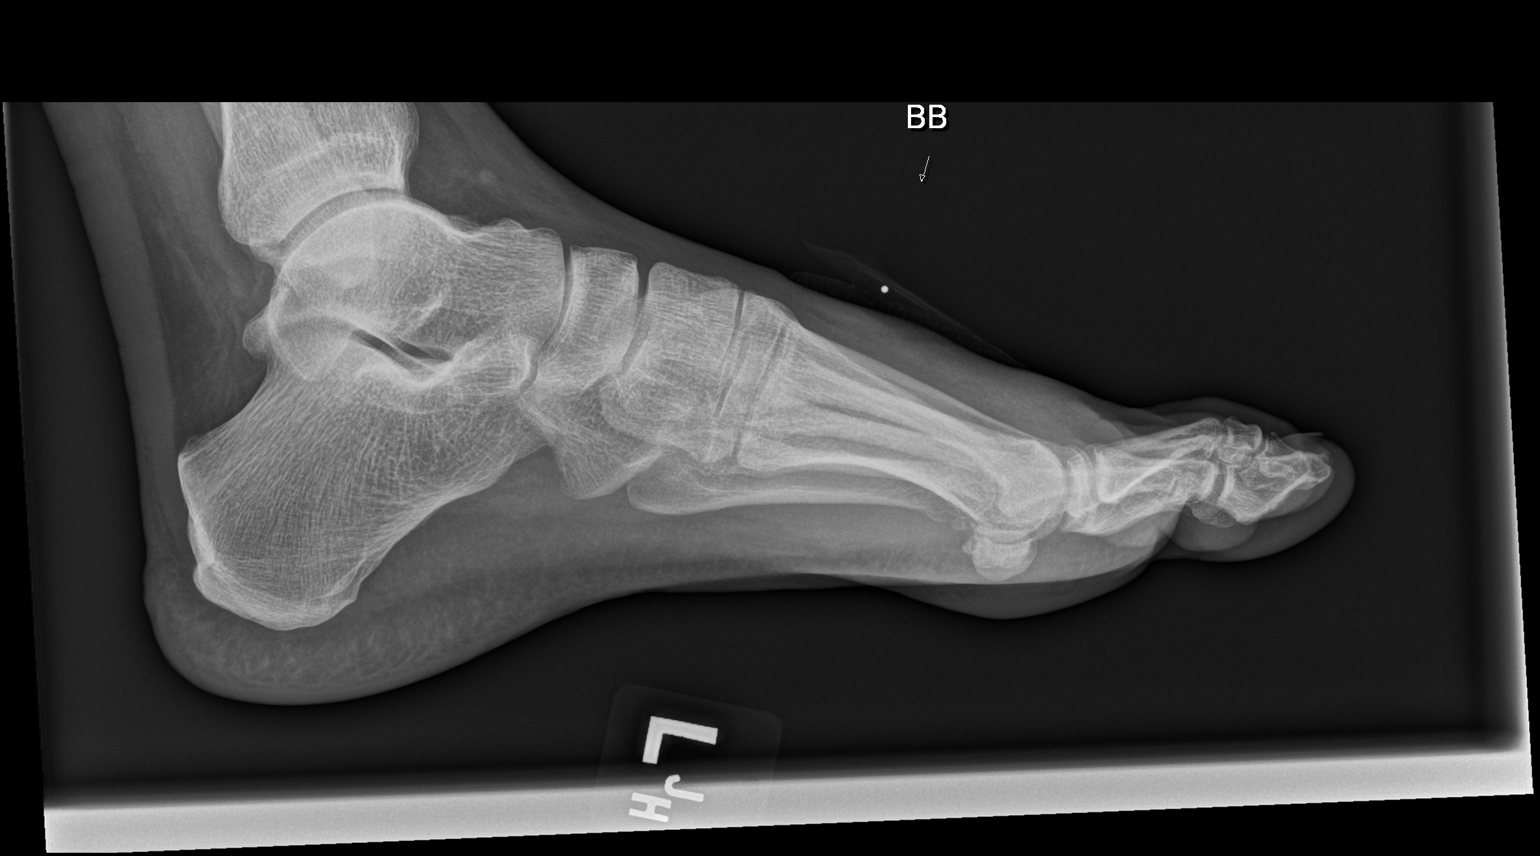

[2 of 2 positions shown; findings below may reference images not displayed]

FINDINGS: Mild soft tissue swelling noted. No acute bony abnormality. Tiny
bony density noted adjacent to the navicula. This most likely
dystrophic calcification.
IMPRESSION: Mild soft tissue swelling. No acute or focal bony abnormality
identified. No evidence of fracture. No evidence of bony erosion..

## 2019-05-18 ENCOUNTER — Ambulatory Visit (INDEPENDENT_AMBULATORY_CARE_PROVIDER_SITE_OTHER): Payer: 59 | Admitting: Pulmonary Disease

## 2019-05-18 DIAGNOSIS — R0600 Dyspnea, unspecified: Secondary | ICD-10-CM

## 2019-05-18 NOTE — Patient Instructions (Signed)
Will check CBC differential, IgE and chest x-ray. Please come in tomorrow to get these done We will also schedule PFTs at next available for further evaluation of her problems of dyspnea with exertion.  Follow up in person in clinic after PFTs for review and plan for next steps

## 2019-05-18 NOTE — Progress Notes (Signed)
Daniel Griffith    [id number]    September 20, 1955  Primary Care Physician:Reade, Herbie [place], MD  Referring Physician: Maury Dus, MD [place],  [place] [address]  Chief complaint: Consult for dyspnea, post Covid  HPI: 64 year old with visit with gastritis, hyperlipidemia Complains of chronic dyspnea on exertion for the past 10 to 12 years.  This occurs mostly at blacking out with chest tightness, unable to take a deep breath. Diagnosed with Covid on 03/17/2019.  He did not require hospitalization.  Post Covid he has numbness, weakness and increase in dyspnea which is more frequent now He continues to stay active with aerobic exercise with a trainer.  Given albuterol inhaler by his primary care which he uses before exercise but does not help with symptoms.  Patient scheduled visit as televisit by mistake for first consult.  Pets: Has a dog Occupation: Management job Exposures: No known exposures.  No mold, hot tub, Jacuzzi.  He has a down comforter Smoking history: Never smoker Travel history: No significant travel history Relevant family history: No significant family history of lung disease   Outpatient Encounter Medications as of 05/18/2019  Medication Sig  . aspirin 81 MG tablet Take 81 mg by mouth daily.   No facility-administered encounter medications on file as of 05/18/2019.    Allergies as of 05/18/2019  . (No Known Allergies)    Past Medical History:  Diagnosis Date  . BCC (basal cell carcinoma of skin)    right cheek  . Blood pressure elevated without history of HTN   . Chronic kidney disease, stage 2 (mild)   . Diminished hearing   . Elevated LFTs    no specific diagnosis  . Gastritis   . GERD (gastroesophageal reflux disease)   . Hyperlipidemia     Past Surgical History:  Procedure Laterality Date  . CYST REMOVAL TRUNK     removed from mid and right anterior neck    Family History  Problem Relation Age of Onset  .  CVA Mother   . Breast cancer Mother   . Alzheimer's disease Father   . Skin cancer Father   . Other Father        CABG at age 45  . Prostate cancer Brother   . Other Brother        Cardiac stenting x 1 at age 68    Social History   Socioeconomic History  . Marital status: Married    Spouse name: Not on file  . Number of children: 2  . Years of education: Not on file  . Highest education level: Not on file  Occupational History  . Not on file  Tobacco Use  . Smoking status: Never Smoker  . Smokeless tobacco: Never Used  Substance and Sexual Activity  . Alcohol use: Yes    Comment: drinks a couple of beers on the weekends  . Drug use: No  . Sexual activity: Not on file  Other Topics Concern  . Not on file  Social History Narrative  . Not on file   Social Determinants of Health   Financial Resource Strain:   . Difficulty of Paying Living Expenses:   Food Insecurity:   . Worried About Charity fundraiser in the Last Year:   . Arboriculturist in the Last Year:   Transportation Needs:   . Film/video editor (Medical):   Marland Kitchen Lack of Transportation (Non-Medical):  Physical Activity:   . Days of Exercise per Week:   . Minutes of Exercise per Session:   Stress:   . Feeling of Stress :   Social Connections:   . Frequency of Communication with Friends and Family:   . Frequency of Social Gatherings with Friends and Family:   . Attends Religious Services:   . Active Member of Clubs or Organizations:   . Attends Archivist Meetings:   Marland Kitchen Marital Status:   Intimate Partner Violence:   . Fear of Current or Ex-Partner:   . Emotionally Abused:   Marland Kitchen Physically Abused:   . Sexually Abused:     Review of systems: Review of Systems  Constitutional: Negative for fever and chills.  HENT: Negative.   Eyes: Negative for blurred vision.  Respiratory: as per HPI  Cardiovascular: Negative for chest pain and palpitations.  Gastrointestinal: Negative for vomiting,  diarrhea, blood per rectum. Genitourinary: Negative for dysuria, urgency, frequency and hematuria.  Musculoskeletal: Negative for myalgias, back pain and joint pain.  Skin: Negative for itching and rash.  Neurological: Negative for dizziness, tremors, focal weakness, seizures and loss of consciousness.  Endo/Heme/Allergies: Negative for environmental allergies.  Psychiatric/Behavioral: Negative for depression, suicidal ideas and hallucinations.  All other systems reviewed and are negative.  Physical Exam: Televisit   Data Reviewed: Imaging: CT abdomen pelvis 07/07/15-visualized lung bases are clear.  I have reviewed the images personally.   Assessment:  Dyspnea on exertion Sounds like exercise-induced bronchospasm. Symptoms worsen after recent Covid infection.  He also has a down comforter at home Hence will need evaluation of any interstitial lung disease  We will check CBC differential, IgE and PFTs Schedule chest x-ray Consider CT scan based on review of these results.  Plan/Recommendations: CBC, IgE, chest x-ray PFTs In person visit after these tests.  Marshell Garfinkel MD [facility] 05/18/2019, 2:22 PM  CC: Maury Dus, MD

## 2019-05-19 ENCOUNTER — Other Ambulatory Visit: Payer: Self-pay

## 2019-05-19 ENCOUNTER — Other Ambulatory Visit (INDEPENDENT_AMBULATORY_CARE_PROVIDER_SITE_OTHER): Payer: 59

## 2019-05-19 ENCOUNTER — Ambulatory Visit (INDEPENDENT_AMBULATORY_CARE_PROVIDER_SITE_OTHER)
Admission: RE | Admit: 2019-05-19 | Discharge: 2019-05-19 | Disposition: A | Discharge status: Home / Self Care | Payer: 59 | Source: Ambulatory Visit | Attending: Pulmonary Disease | Admitting: Pulmonary Disease

## 2019-05-19 DIAGNOSIS — R0609 Other forms of dyspnea: Secondary | ICD-10-CM

## 2019-05-19 DIAGNOSIS — R0600 Dyspnea, unspecified: Secondary | ICD-10-CM

## 2019-05-19 LAB — CBC WITH DIFFERENTIAL/PLATELET
Basophils Absolute: 0.1 10*3/uL (ref 0.0–0.1)
Basophils Relative: 0.9 % (ref 0.0–3.0)
Eosinophils Absolute: 0.3 10*3/uL (ref 0.0–0.7)
Eosinophils Relative: 4.6 % (ref 0.0–5.0)
HCT: 45.7 % (ref 39.0–52.0)
Hemoglobin: 15.9 g/dL (ref 13.0–17.0)
Lymphocytes Relative: 27.4 % (ref 12.0–46.0)
Lymphs Abs: 1.7 10*3/uL (ref 0.7–4.0)
MCHC: 34.8 g/dL (ref 30.0–36.0)
MCV: 86.2 fl (ref 78.0–100.0)
Monocytes Absolute: 0.4 10*3/uL (ref 0.1–1.0)
Monocytes Relative: 6.6 % (ref 3.0–12.0)
Neutro Abs: 3.7 10*3/uL (ref 1.4–7.7)
Neutrophils Relative %: 60.5 % (ref 43.0–77.0)
Platelets: 190 10*3/uL (ref 150.0–400.0)
RBC: 5.3 Mil/uL (ref 4.22–5.81)
RDW: 13.4 % (ref 11.5–15.5)
WBC: 6.1 10*3/uL (ref 4.0–10.5)

## 2019-05-20 LAB — IGE: IgE (Immunoglobulin E), Serum: 41 kU/L (ref <=114)

## 2019-06-18 ENCOUNTER — Other Ambulatory Visit: Payer: Self-pay

## 2019-06-18 ENCOUNTER — Ambulatory Visit (INDEPENDENT_AMBULATORY_CARE_PROVIDER_SITE_OTHER): Payer: No Typology Code available for payment source | Admitting: Pulmonary Disease

## 2019-06-18 DIAGNOSIS — R0600 Dyspnea, unspecified: Secondary | ICD-10-CM | POA: Diagnosis not present

## 2019-06-18 DIAGNOSIS — R0609 Other forms of dyspnea: Secondary | ICD-10-CM

## 2019-06-18 LAB — PULMONARY FUNCTION TEST
DL/VA % pred: 92 %
DL/VA: 3.84 ml/min/mmHg/L
DLCO cor % pred: 95 %
DLCO cor: 28.79 ml/min/mmHg
DLCO unc % pred: 99 %
DLCO unc: 29.79 ml/min/mmHg
FEF 25-75 Post: 3.23 L/sec
FEF 25-75 Pre: 2.41 L/sec
FEF2575-%Change-Post: 34 %
FEF2575-%Pred-Post: 100 %
FEF2575-%Pred-Pre: 74 %
FEV1-%Change-Post: 5 %
FEV1-%Pred-Post: 95 %
FEV1-%Pred-Pre: 90 %
FEV1-Post: 3.78 L
FEV1-Pre: 3.57 L
FEV1FVC-%Change-Post: 5 %
FEV1FVC-%Pred-Pre: 94 %
FEV6-%Change-Post: 0 %
FEV6-%Pred-Post: 99 %
FEV6-%Pred-Pre: 99 %
FEV6-Post: 5 L
FEV6-Pre: 4.98 L
FEV6FVC-%Change-Post: 0 %
FEV6FVC-%Pred-Post: 104 %
FEV6FVC-%Pred-Pre: 104 %
FVC-%Change-Post: 0 %
FVC-%Pred-Post: 95 %
FVC-%Pred-Pre: 94 %
FVC-Post: 5.02 L
FVC-Pre: 4.99 L
Post FEV1/FVC ratio: 75 %
Post FEV6/FVC ratio: 100 %
Pre FEV1/FVC ratio: 72 %
Pre FEV6/FVC Ratio: 100 %
RV % pred: 158 %
RV: 3.84 L
TLC % pred: 116 %
TLC: 8.88 L

## 2019-06-18 NOTE — Progress Notes (Signed)
Full PFT performed today. °

## 2019-07-28 ENCOUNTER — Ambulatory Visit (INDEPENDENT_AMBULATORY_CARE_PROVIDER_SITE_OTHER): Payer: No Typology Code available for payment source | Admitting: Pulmonary Disease

## 2019-07-28 ENCOUNTER — Other Ambulatory Visit: Payer: Self-pay

## 2019-07-28 ENCOUNTER — Encounter: Payer: Self-pay | Admitting: Pulmonary Disease

## 2019-07-28 VITALS — BP 124/62 | HR 62 | Temp 97.6°F | Ht 73.0 in | Wt 187.6 lb

## 2019-07-28 DIAGNOSIS — R0600 Dyspnea, unspecified: Secondary | ICD-10-CM

## 2019-07-28 DIAGNOSIS — R0609 Other forms of dyspnea: Secondary | ICD-10-CM

## 2019-07-28 MED ORDER — BREO ELLIPTA 200-25 MCG/INH IN AEPB: 1.0000 | INHALATION_SPRAY | Freq: Every day | RESPIRATORY_TRACT | 2 refills | Status: DC

## 2019-07-28 MED ORDER — BREO ELLIPTA 100-25 MCG/INH IN AEPB: 1.0000 | INHALATION_SPRAY | Freq: Every day | RESPIRATORY_TRACT | 0 refills | Status: DC

## 2019-07-28 NOTE — Progress Notes (Signed)
         Daniel Griffith    [id number]    April 05, 1955  Primary Care Physician:Reade, Herbie [place], MD  Referring Physician: Maury Dus, MD [place],  [place] [address]  Chief complaint: Follow-up dyspnea, post Covid  HPI: 64 year old with visit with gastritis, hyperlipidemia Complains of chronic dyspnea on exertion for the past 10 to 12 years.  This occurs mostly at blacking out with chest tightness, unable to take a deep breath. Diagnosed with Covid in Dec 2021 and vaccine in march 2021.  He did not require hospitalization.  Post Covid he has numbness, weakness and increase in dyspnea which is more frequent now He continues to stay active with aerobic exercise with a trainer.  Given albuterol inhaler by his primary care which he uses before exercise but does not help with symptoms.  Pets: Has a dog Occupation: Management job Exposures: No known exposures.  No mold, hot tub, Jacuzzi.  He has a down comforter Smoking history: Never smoker Travel history: No significant travel history Relevant family history: No significant family history of lung disease  Interim history: Continues to have dyspnea, chest tightness post exercise. States that albuterol inhaler prior to exercise is not helping  Outpatient Encounter Medications as of 07/28/2019  Medication Sig  . aspirin 81 MG tablet Take 81 mg by mouth daily.  Marland Kitchen olmesartan (BENICAR) 20 MG tablet Take 20 mg by mouth every morning.  . rosuvastatin (CRESTOR) 10 MG tablet Take 10 mg by mouth once a week.   No facility-administered encounter medications on file as of 07/28/2019.   Physical Exam: Blood pressure 124/62, pulse 62, temperature 97.6 F (36.4 C), temperature source Oral, height 6\' 1"  (1.854 m), weight 187 lb 9.6 oz (85.1 kg), SpO2 99 %. Gen:      No acute distress HEENT:  EOMI, sclera anicteric Neck:     No masses; no thyromegaly Lungs:    Clear to auscultation bilaterally; normal respiratory  effort CV:         Regular rate and rhythm; no murmurs Abd:      + bowel sounds; soft, non-tender; no palpable masses, no distension Ext:    No edema; adequate peripheral perfusion Skin:      Warm and dry; no rash Neuro: alert and oriented x 3 Psych: normal mood and affect   Data Reviewed: Imaging: CT abdomen pelvis 07/07/15-visualized lung bases are clear.  I have reviewed the images personally. Chest x-ray 05/19/2019-no acute pulmonary disease.  I have reviewed the images personally.  PFTs: 06/18/2019 FVC 5.02 [95%], FEV1 3.78 [95%], F/F 75, TLC 8.88 [116%] DLCO 29.79 [99%] Minimal obstructive airway disease  Labs: CBC 05/19/2019-WBC 6.1, eos 4.6%, absolute eosinophil count 218 IgE 05/19/2019-41  Assessment:  Dyspnea on exertion Sounds like exercise-induced bronchospasm. Symptoms worsen after recent Covid infection.  He also has a down comforter at home but no evidence of interstitial lung disease PFTs reviewed with normal F/F ratio but improvement in mid flow rates and curvature to the flow loop suggestive of small airways disease Discussed cardiopulmonary exercise test for better evaluation but he had an exercise test in 2009 and most recently a treadmill stress test in 2019 and is not interested in another test  We will start him on Breo and see if it helps Follow-up in 3 months.  Plan/Recommendations: Breo inhaler.  Marshell Garfinkel MD [facility] 07/28/2019, 8:38 AM  CC: Maury Dus, MD

## 2019-07-28 NOTE — Patient Instructions (Addendum)
I have reviewed your labs and lung function test They are mostly normal with very minimal changes suggestive of exercise-induced bronchospasm, reactive airway disease  We will start you on an inhaler called Breo to see if it helps Follow-up in 3 months.

## 2019-11-04 ENCOUNTER — Other Ambulatory Visit: Payer: Self-pay | Admitting: Pulmonary Disease

## 2020-02-20 ENCOUNTER — Other Ambulatory Visit: Payer: Self-pay | Admitting: Pulmonary Disease

## 2022-03-21 ENCOUNTER — Other Ambulatory Visit: Payer: Self-pay | Admitting: Physician Assistant

## 2022-03-21 DIAGNOSIS — E785 Hyperlipidemia, unspecified: Secondary | ICD-10-CM

## 2022-04-02 ENCOUNTER — Ambulatory Visit
Admission: RE | Admit: 2022-04-02 | Discharge: 2022-04-02 | Disposition: A | Discharge status: Home / Self Care | Payer: No Typology Code available for payment source | Source: Ambulatory Visit | Attending: Physician Assistant | Admitting: Physician Assistant

## 2022-04-02 DIAGNOSIS — E785 Hyperlipidemia, unspecified: Secondary | ICD-10-CM

## 2023-02-27 ENCOUNTER — Other Ambulatory Visit (INDEPENDENT_AMBULATORY_CARE_PROVIDER_SITE_OTHER): Payer: Medicare HMO

## 2023-02-27 ENCOUNTER — Ambulatory Visit: Payer: Medicare HMO | Admitting: Sports Medicine

## 2023-02-27 ENCOUNTER — Other Ambulatory Visit: Payer: Self-pay

## 2023-02-27 ENCOUNTER — Encounter: Payer: Self-pay | Admitting: Sports Medicine

## 2023-02-27 DIAGNOSIS — M25572 Pain in left ankle and joints of left foot: Secondary | ICD-10-CM | POA: Diagnosis not present

## 2023-02-27 DIAGNOSIS — M6788 Other specified disorders of synovium and tendon, other site: Secondary | ICD-10-CM | POA: Diagnosis not present

## 2023-02-27 DIAGNOSIS — M79662 Pain in left lower leg: Secondary | ICD-10-CM

## 2023-02-27 DIAGNOSIS — M19031 Primary osteoarthritis, right wrist: Secondary | ICD-10-CM

## 2023-02-27 NOTE — Progress Notes (Signed)
 Patient says that about 1 and a half years ago he began having pain and noticing a "bulge" in his left achilles. He says that he retired 6 months ago and stopped going to the gym at that time as well. He has been limping on the leg since his pain began, although says that he has been back in the gym for the last 6 days using the treadmill, elliptical, and bike, and has not been limping in that time. He does not have pain at rest. He has been given heel lifts but has not gotten any relief of pain with those. He takes Meloxicam but not specifically for the achilles. He says that he has rolled his left calf and used heat with no relief. He describes similar pain in the right leg 5-6 years ago that resolved by rolling the left calf.

## 2023-02-27 NOTE — Progress Notes (Signed)
 DAILY CRATE - 68 y.o. male MRN [id number]  Date of birth: 1955/07/03  Office Visit Note: Visit Date: 02/27/2023 PCP: Elias Else, MD Referred by: Madelyn Brunner, DO  Subjective: Chief Complaint  Patient presents with   Left Ankle - Pain   HPI: Daniel Griffith is a pleasant 68 y.o. male who presents today for chronic left Achilles pain and thickening.  Daniel Griffith states that over the last year he has been having some pain as well as noticing a bulge in the left Achilles.  He had a similar incident on the right side years ago that got better with activity modification.  For his left side, he has stopped going to the gym and has tried activity modification without significant relief.  He has also tried heel lifts for a number of months without improvement.  He does take meloxicam 15 mg once daily for his right wrist arthritis his pain as well as left shoulder pain.  Pertinent ROS were reviewed with the patient and found to be negative unless otherwise specified above in HPI.   Assessment & Plan: Visit Diagnoses:  1. Achilles tendinosis of left lower extremity   2. Osteoarthritis of right wrist, unspecified osteoarthritis type   3. Pain of left calf    Plan: Impression is chronic Achilles tendinosis with thickening of the mid substance of the left Achilles.  Ultrasound shows no evidence of tearing however, which is reassuring.  We did perform a trial of extracorporeal shockwave therapy, patient tolerated well.  I would like to have a few treatments of this as well as getting him into formalized physical therapy to work on Achilles lengthening and strengthening.  I also feel he would benefit from iontophoresis for the Achilles and could consider Grasston or dry needling for the reciprocal calf tightness.  He may continue his meloxicam 15 mg once daily for the Achilles pain as well as his right wrist/hand arthritis.  Will follow-up over the next 1-2 weeks for repeat shockwave and further  evaluation.  Additional treatment considerations: Heel lifts, nitroglycerin patch protocol  Follow-up: Return in about 12 days (around 03/11/2023) for either 3/4 or 3/6 for achilles f/u .   Meds & Orders: No orders of the defined types were placed in this encounter.   Orders Placed This Encounter  Procedures   XR Ankle Complete Left   [place] Extrem Low Left Ltd     Procedures: Procedure: ECSWT Indications:  Achilles pain   Procedure Details Consent: Risks of procedure as well as the alternatives and risks of each were explained to the patient.  Verbal consent for procedure obtained. Time Out: Verified patient identification, verified procedure, site was marked, verified correct patient position. The area was cleaned with alcohol swab.     The left achilles tendon was targeted for Extracorporeal shockwave therapy.    Preset: Achillodynia Power Level: 110 mJ Frequency: 12 Hz Impulse/cycles: 2500 Head size: Regular   Patient tolerated procedure well without immediate complications.       Clinical History: No specialty comments available.  He reports that he has never smoked. He has never used smokeless tobacco. No results for input(s): "HGBA1C", "LABURIC" in the last 8760 hours.  Objective:    Physical Exam  Gen: Well-appearing, in no acute distress; non-toxic CV: Well-perfused. Warm.  Resp: Breathing unlabored on room air; no wheezing. Psych: Fluid speech in conversation; appropriate affect; normal thought process  Ortho Exam - LLE: There is thickening with tenderness to palpation in the mid substance  of the Achilles tendon.  Slight bogginess with inflammation.  No significant redness or ankle effusion.  There is about 7-8 degrees less of dorsiflexion compared to the contralateral side.  Thompson squeeze test intact.  Imaging:  - Reviewed 2v foot xr from 03/07/2017 for comparison, today. Narrative & Impression  CLINICAL DATA:  Left foot injury with wound.   EXAM: LEFT  FOOT - 2 VIEW   COMPARISON:  No recent.   FINDINGS: Mild soft tissue swelling noted. No acute bony abnormality. Tiny bony density noted adjacent to the navicula. This most likely dystrophic calcification.   IMPRESSION: Mild soft tissue swelling. No acute or focal bony abnormality identified. No evidence of fracture. No evidence of bony erosion.     Electronically Signed   By: Maisie Fus  Register   On: 03/07/2017 16:08   [place] Extrem Low Left Ltd Result Date: 02/27/2023 Limited musculoskeletal ultrasound of the left Achilles was performed today.  There is notable thickening and tendinopathy of the left Achilles at the mid substance.  There is no abnormality proximally at the myotendinous junction or distally at the calcaneal insertion.  Mid substance thickening of 1.1 cm on the left compared to 0.675 cm on the right.  Short axis view does show a small degree of hypoechoic change with calcification indicative of chronic tendinopathy but there is no evidence of tearing.  XR Ankle Complete Left Result Date: 02/27/2023 3 views of the left ankle including AP, oblique and lateral femoral ordered and reviewed by myself.  X-rays demonstrate small prominence of the posterior tibia but no os trigonum or acute fracture.  Very mild age-appropriate midfoot arthritic change.  Otherwise no other acute bony abnormality noted.     Past Medical/Family/Surgical/Social History: Medications & Allergies reviewed per EMR, new medications updated. Patient Active Problem List   Diagnosis Date Noted   BCC (basal cell carcinoma of skin)    Acute medial meniscus tear of right knee 10/26/2014   Achilles tendon pain 06/07/2013   Past Medical History:  Diagnosis Date   BCC (basal cell carcinoma of skin)    right cheek   Blood pressure elevated without history of HTN    Chronic kidney disease, stage 2 (mild)    Diminished hearing    Elevated LFTs    no specific diagnosis   Gastritis    GERD (gastroesophageal  reflux disease)    Hyperlipidemia    Family History  Problem Relation Age of Onset   CVA Mother    Breast cancer Mother    Alzheimer's disease Father    Skin cancer Father    Other Father        CABG at age 63   Prostate cancer Brother    Other Brother        Cardiac stenting x 1 at age 48   Past Surgical History:  Procedure Laterality Date   CYST REMOVAL TRUNK     removed from mid and right anterior neck   Social History   Occupational History   Not on file  Tobacco Use   Smoking status: Never   Smokeless tobacco: Never  Vaping Use   Vaping status: Never Used  Substance and Sexual Activity   Alcohol use: Yes    Comment: drinks a couple of beers on the weekends   Drug use: No   Sexual activity: Not on file

## 2023-03-05 NOTE — Therapy (Signed)
 OUTPATIENT PHYSICAL THERAPY LOWER EXTREMITY EVALUATION  Patient Name: NATALIE LECLAIRE MRN: [id number] DOB:[birth year], 68 y.o., male Today's Date: 03/06/2023  END OF SESSION:  PT End of Session - 03/06/23 1819     Visit Number 1    Number of Visits 8    Date for PT Re-Evaluation 05/01/23    Authorization Type AET[employer]    Progress Note Due on Visit 8    PT Start Time 0930    PT Stop Time 1015    PT Time Calculation (min) 45 min    Activity Tolerance Patient tolerated treatment well;No increased pain    Behavior During Therapy WFL for tasks assessed/performed             Past Medical History:  Diagnosis Date   BCC (basal cell carcinoma of skin)    right cheek   Blood pressure elevated without history of HTN    Chronic kidney disease, stage 2 (mild)    Diminished hearing    Elevated LFTs    no specific diagnosis   Gastritis    GERD (gastroesophageal reflux disease)    Hyperlipidemia    Past Surgical History:  Procedure Laterality Date   CYST REMOVAL TRUNK     removed from mid and right anterior neck   Patient Active Problem List   Diagnosis Date Noted   BCC (basal cell carcinoma of skin)    Acute medial meniscus tear of right knee 10/26/2014   Achilles tendon pain 06/07/2013    PCP: Elias Else, MD  REFERRING PROVIDER: Madelyn Brunner, DO  REFERRING DIAG:  Diagnosis  M67.88 (ICD-10-CM) - Achilles tendinosis of left lower extremity  M79.662 (ICD-10-CM) - Pain of left calf    THERAPY DIAG:  Difficulty in walking, not elsewhere classified - Plan: PT plan of care cert/re-cert  Muscle weakness (generalized) - Plan: PT plan of care cert/re-cert  Stiffness of left ankle, not elsewhere classified - Plan: PT plan of care cert/re-cert  Pain in left ankle and joints of left foot - Plan: PT plan of care cert/re-cert  Localized edema - Plan: PT plan of care cert/re-cert  Rationale for Evaluation and Treatment: Rehabilitation  ONSET DATE: 1.5 years  SUBJECTIVE:    SUBJECTIVE STATEMENT: Diontae is back in the gym religiously for 2-3 weeks after 6 months off.  He notes difficulty descending stairs in the morning.  Roe Coombs was an avid runner but had to stop due to his Achilles and knees.    PERTINENT HISTORY: Chronic kidney disease, bilateral Achilles tendinitis  PAIN:  Are you having pain? Yes: NPRS scale: 0-3/10 this month Pain location: Left Achilles Pain description: Stiff, "like a sprained ankle" Aggravating factors: Descending stairs in the morning Relieving factors: Returning to the gym  PRECAUTIONS: Other: Avoid overuse  RED FLAGS: None   WEIGHT BEARING RESTRICTIONS:  Avoid overuse  FALLS:  Has patient fallen in last 6 months? No  LIVING ENVIRONMENT: Lives with: lives with their family and lives with their spouse Lives in: House/apartment Stairs:  Difficulty descending stairs in the morning Has following equipment at home: None  OCCUPATION: Retired, enjoys the outdoors (golf, fishing, Hydrologist).  PLOF: Independent  PATIENT GOALS: Return to activities without pain.  NEXT MD VISIT: NA  OBJECTIVE:  Note: Objective measures were completed at Evaluation unless otherwise noted.  DIAGNOSTIC FINDINGS: Limited musculoskeletal ultrasound of the left Achilles was performed  today.  There is notable thickening and tendinopathy of the left Achilles  at the mid substance.  There is no  abnormality proximally at the  myotendinous junction or distally at the calcaneal insertion.  Mid  substance thickening of 1.1 cm on the left compared to 0.675 cm on the  right.  Short axis view does show a small degree of hypoechoic change with  calcification indicative of chronic tendinopathy but there is no evidence  of tearing.  PATIENT SURVEYS:  Patient Specific Functional Scale: Running 3/10; Walking 8/10 Total 11/20 (Goal 13/20)  COGNITION: Overall cognitive status: Within functional limits for tasks assessed     SENSATION: WFL  EDEMA:  Some  edema noted   LOWER EXTREMITY ROM:  Active ROM Left/Right 03/06/2023   Hip flexion    Hip extension    Hip abduction    Hip adduction    Hip internal rotation    Hip external rotation    Knee flexion    Knee extension    Ankle dorsiflexion 16/9   Ankle plantarflexion    Ankle inversion    Ankle eversion     (Blank rows = not tested)  LOWER EXTREMITY STRENGTH: Calf girth assessed in cm, 15 cm from the inferior patellar pole.  Inversion & eversion assessed in pounds with a hand-held dynamometer.  See description above Left/Right 03/06/2023   Hip flexion    Hip extension    Hip abduction    Hip adduction    Hip internal rotation    Hip external rotation    Knee flexion    Knee extension    Ankle dorsiflexion    Ankle plantarflexion 33.0/35.5 cm calf girth   Ankle inversion 51.3/53.0   Ankle eversion 49.9/51.4    (Blank rows = not tested)  GAIT: Distance walked: 100 feet Assistive device utilized: None Level of assistance: Complete Independence Comments: Left calf atrophy noted                                                                                                                                TREATMENT DATE:  03/06/2023 Heel Raises, up with both and hold 3 seconds, slow eccentrics left side only 10 x 3 seconds Upper and Lower heel cords box (slight toe in) 1 minute each  97535 (Education/ADLs): Discussed exam findings, significance of 2.5 cm of calf atrophy, the importance of avoiding overuse and the expected duration of supervised physical therapy (8 weeks) versus the amount of time expected to normalize calf muscle strength (6-9 months for 2.5 cm of atrophy).    PATIENT EDUCATION:  Education details: See above Person educated: Patient Education method: Explanation, Demonstration, Tactile cues, Verbal cues, and Handouts Education comprehension: verbalized understanding, returned demonstration, verbal cues required, tactile cues required, and needs further  education  HOME EXERCISE PROGRAM: Access Code: [email or web address] URL: [email or web address] Date: 03/06/2023 Prepared by: Pauletta Browns  Exercises - Standing Heel Raise with Chair Support  - 5 x daily - 7 x weekly - 1-2 sets - 10 reps - 3 seconds hold - Slant Board Gastrocnemius Stretch  -  2 x daily - 7 x weekly - 1 sets - 3 reps - 60 hold - Slant Board Soleus Stretch  - 2 x daily - 7 x weekly - 1 sets - 3 reps - 60 hold  ASSESSMENT:  CLINICAL IMPRESSION: Patient is a 68 y.o. male who was seen today for physical therapy evaluation and treatment for  Diagnosis  M67.88 (ICD-10-CM) - Achilles tendinosis of left lower extremity  M79.662 (ICD-10-CM) - Pain of left calf  .  Orton has a history of bilateral Achilles tendonitis (left worse than right).  Lupita Leash is typically very active and was a serious runner up until his Achilles and knees forced him to stop.  He took 6 months off and noted his symptoms got worse when he was inactive.  Roe Coombs started back to the gym several weeks ago and has been getting better since that time.  Significantly, he still has significant left calf atrophy as compared to the right and both triceps surae will benefit from strengthening.  Because Roe Coombs appears to be on the right track, I updated and reviewed his home exercises and Roe Coombs chooses to continue physical therapy on more of a consultant basis with mostly independent rehabilitation.  I feel this is appropriate given the significant atrophy that he has and his motivation to do well on his own.  He will follow-up every few weeks as needed to assess progress with his symptoms and objective strength.  OBJECTIVE IMPAIRMENTS: Abnormal gait, decreased activity tolerance, decreased endurance, decreased knowledge of condition, difficulty walking, decreased ROM, decreased strength, increased edema, increased fascial restrictions, impaired perceived functional ability, impaired flexibility, and pain.   ACTIVITY LIMITATIONS:  stairs and locomotion level  PARTICIPATION LIMITATIONS: community activity and running  PERSONAL FACTORS: Chronic kidney disease, bilateral Achilles tendinitis are also affecting patient's functional outcome.   REHAB POTENTIAL: Good  CLINICAL DECISION MAKING: Stable/uncomplicated  EVALUATION COMPLEXITY: Low   GOALS: Goals reviewed with patient? Yes  SHORT TERM GOALS: Target date: 04/17/2023 Roe Coombs will be independent with his day 1 home exercise program Baseline: Started 03/06/2023 Goal status: INITIAL  2.  Improve right heel cords flexibility to 15 degrees Baseline: 9 degrees Goal status: INITIAL  3.  Roe Coombs will be able to complete 100 double leg concentric and single-leg eccentric on the left heel raises per day without symptom exacerbation Baseline: Fatigue and atrophy noted at 10 at evaluation Goal status: INITIAL   LONG TERM GOALS: Target date: 05/29/2023  Improve patient's specific functional scale to 13/20 or better Baseline: 11/20 for running (3) and walking (8) Goal status: INITIAL  2.  Roe Coombs will report bilateral Achilles pain as consistently   0-1/10 on the visual analog scale Baseline: 0-3/10 this month Goal status: INITIAL  3.  Roe Coombs will have 1 cm or less left calf atrophy as assessed 15 cm distal to the inferior patellar pole, demonstrating improved strength Baseline: 2.5 cm of atrophy Goal status: INITIAL  4.  Roe Coombs will report no significant increases in pain above a 1/10 with walking 3 or more miles Baseline: Just started back at the gym a few weeks ago Goal status: INITIAL  5.  Roe Coombs will be independent with his long-term home maintenance exercise program at discharge Baseline: Started 03/06/2023 Goal status: INITIAL   PLAN:  PT FREQUENCY:  Up to 1 time a week over the next 12 weeks  PT DURATION: 12 weeks  PLANNED INTERVENTIONS: 97110-Therapeutic exercises, 97530- Therapeutic activity, [id number]- Neuromuscular re-education, 97535- Self Care, 16109- Manual  therapy, [id number]- Gait  training, 16109- Vasopneumatic device, [phone]- Ionotophoresis 4mg /ml Dexamethasone, Patient/Family education, Balance training, Stair training, Dry Needling, Scar mobilization, and Cryotherapy  PLAN FOR NEXT SESSION: How many heel raises (up with both, down with left only) is he completing on a daily basis?  Consider introducing eccentric step activities due to his difficulty descending stairs in the morning.  Discussed iontophoresis and Graston Technique given Dr. Shon Baton referral.   Cherlyn Cushing, PT, MPT 03/06/2023, 7:36 PM

## 2023-03-06 ENCOUNTER — Ambulatory Visit: Payer: Medicare HMO | Admitting: Rehabilitative and Restorative Service Providers"

## 2023-03-06 ENCOUNTER — Encounter: Payer: Self-pay | Admitting: Rehabilitative and Restorative Service Providers"

## 2023-03-06 DIAGNOSIS — M6281 Muscle weakness (generalized): Secondary | ICD-10-CM

## 2023-03-06 DIAGNOSIS — R262 Difficulty in walking, not elsewhere classified: Secondary | ICD-10-CM

## 2023-03-06 DIAGNOSIS — M25572 Pain in left ankle and joints of left foot: Secondary | ICD-10-CM

## 2023-03-06 DIAGNOSIS — R600 Localized edema: Secondary | ICD-10-CM

## 2023-03-06 DIAGNOSIS — M25672 Stiffness of left ankle, not elsewhere classified: Secondary | ICD-10-CM | POA: Diagnosis not present

## 2023-03-11 ENCOUNTER — Encounter: Payer: Medicare HMO | Admitting: Physical Therapy

## 2023-03-14 ENCOUNTER — Encounter: Payer: Self-pay | Admitting: Sports Medicine

## 2023-03-14 ENCOUNTER — Ambulatory Visit: Payer: Medicare HMO | Admitting: Sports Medicine

## 2023-03-14 DIAGNOSIS — M25572 Pain in left ankle and joints of left foot: Secondary | ICD-10-CM | POA: Diagnosis not present

## 2023-03-14 DIAGNOSIS — M6788 Other specified disorders of synovium and tendon, other site: Secondary | ICD-10-CM | POA: Diagnosis not present

## 2023-03-14 NOTE — Progress Notes (Signed)
 Patient says that he is feeling about 10-20% better. He says that he is doing his exercises from physical therapy and those are going well. He says that he walks everyday on the treadmill with an incline and every once and awhile will feel some tightness a little higher in his calf. He is working on pushing off while walking rather than just walking with a flat foot, as well as letting his ankle move while on the stationary bike.

## 2023-03-14 NOTE — Progress Notes (Signed)
 Daniel Griffith - 68 y.o. male MRN [id number]  Date of birth: 1955/10/04  Office Visit Note: Visit Date: 03/14/2023 PCP: Elias Else, MD Referred by: Elias Else, MD  Subjective: Chief Complaint  Patient presents with   Left Ankle - Follow-up   HPI: Daniel Griffith is a pleasant 68 y.o. male who presents today for chronic left Achilles pain with tendinosis.  Daniel Griffith did have a positive response to our first extracorporeal shockwave therapy, feels that he is about 10-20% better after the first treatment.  He had his physical therapy evaluation which was helpful as they noted some calf atrophy and gave him both stretching and strengthening exercises to work on.  He still takes his meloxicam 15 mg once daily.  Pertinent ROS were reviewed with the patient and found to be negative unless otherwise specified above in HPI.   Assessment & Plan: Visit Diagnoses:  1. Achilles tendinosis of left lower extremity   2. Pain in left ankle and joints of left foot    Plan: Impression is chronic Achilles tendinosis substance with tendon thickening.  He did receive a positive response to our first treatment of extracorporeal shockwave therapy, we did repeat this treatment today.  He will continue through his strengthening and stretching exercises for the Achilles and the calf musculature.  We will see him back in about 2 weeks when he has his formal PT appointment as well.  He may continue his meloxicam 15 mg daily.  We will plan for a few cumulative treatments of shockwave to see what benefit he has going forward.  Follow-up: Return in about 13 days (around 03/27/2023) for L-achilles .   Meds & Orders: No orders of the defined types were placed in this encounter.  No orders of the defined types were placed in this encounter.    Procedures: Procedure: ECSWT Indications:  Achilles pain   Procedure Details Consent: Risks of procedure as well as the alternatives and risks of each were explained to  the patient.  Verbal consent for procedure obtained. Time Out: Verified patient identification, verified procedure, site was marked, verified correct patient position. The area was cleaned with alcohol swab.     The left achilles tendon was targeted for Extracorporeal shockwave therapy.    Preset: Achillodynia Power Level: 120 mJ Frequency: 13 Hz Impulse/cycles: 2800 Head size: Regular   Patient tolerated procedure well without immediate complications.      Clinical History: No specialty comments available.  He reports that he has never smoked. He has never used smokeless tobacco. No results for input(s): "HGBA1C", "LABURIC" in the last 8760 hours.  Objective:    Physical Exam  Gen: Well-appearing, in no acute distress; non-toxic CV: Well-perfused. Warm.  Resp: Breathing unlabored on room air; no wheezing. Psych: Fluid speech in conversation; appropriate affect; normal thought process  Ortho Exam - Left ankle/achilles: + Thickening with tendinosis in the mid substance of the Achilles tendon.  Still a degree of Achilles contracture with mild calf atrophy compared to the contralateral side.  Imaging:  Previous imaging:   [place] Extrem Low Left Ltd Limited musculoskeletal ultrasound of the left Achilles was performed  today.  There is notable thickening and tendinopathy of the left Achilles  at the mid substance.  There is no abnormality proximally at the  myotendinous junction or distally at the calcaneal insertion.  Mid  substance thickening of 1.1 cm on the left compared to 0.675 cm on the  right.  Short axis view does show  a small degree of hypoechoic change with  calcification indicative of chronic tendinopathy but there is no evidence  of tearing. XR Ankle Complete Left 3 views of the left ankle including AP, oblique and lateral femoral  ordered and reviewed by myself.  X-rays demonstrate small prominence of  the posterior tibia but no os trigonum or acute fracture.  Very  mild  age-appropriate midfoot arthritic change.  Otherwise no other acute bony  abnormality noted.    Past Medical/Family/Surgical/Social History: Medications & Allergies reviewed per EMR, new medications updated. Patient Active Problem List   Diagnosis Date Noted   BCC (basal cell carcinoma of skin)    Acute medial meniscus tear of right knee 10/26/2014   Achilles tendon pain 06/07/2013   Past Medical History:  Diagnosis Date   BCC (basal cell carcinoma of skin)    right cheek   Blood pressure elevated without history of HTN    Chronic kidney disease, stage 2 (mild)    Diminished hearing    Elevated LFTs    no specific diagnosis   Gastritis    GERD (gastroesophageal reflux disease)    Hyperlipidemia    Family History  Problem Relation Age of Onset   CVA Mother    Breast cancer Mother    Alzheimer's disease Father    Skin cancer Father    Other Father        CABG at age 10   Prostate cancer Brother    Other Brother        Cardiac stenting x 1 at age 75   Past Surgical History:  Procedure Laterality Date   CYST REMOVAL TRUNK     removed from mid and right anterior neck   Social History   Occupational History   Not on file  Tobacco Use   Smoking status: Never   Smokeless tobacco: Never  Vaping Use   Vaping status: Never Used  Substance and Sexual Activity   Alcohol use: Yes    Comment: drinks a couple of beers on the weekends   Drug use: No   Sexual activity: Not on file

## 2023-03-24 DIAGNOSIS — R051 Acute cough: Secondary | ICD-10-CM | POA: Diagnosis not present

## 2023-03-27 ENCOUNTER — Encounter: Payer: Medicare HMO | Admitting: Rehabilitative and Restorative Service Providers"

## 2023-03-27 ENCOUNTER — Ambulatory Visit: Admitting: Sports Medicine

## 2023-03-27 DIAGNOSIS — J45990 Exercise induced bronchospasm: Secondary | ICD-10-CM | POA: Diagnosis not present

## 2023-03-27 DIAGNOSIS — I10 Essential (primary) hypertension: Secondary | ICD-10-CM | POA: Diagnosis not present

## 2023-03-27 DIAGNOSIS — Z0000 Encounter for general adult medical examination without abnormal findings: Secondary | ICD-10-CM | POA: Diagnosis not present

## 2023-03-27 DIAGNOSIS — J189 Pneumonia, unspecified organism: Secondary | ICD-10-CM | POA: Diagnosis not present

## 2023-03-27 DIAGNOSIS — E785 Hyperlipidemia, unspecified: Secondary | ICD-10-CM | POA: Diagnosis not present

## 2023-03-27 DIAGNOSIS — Z125 Encounter for screening for malignant neoplasm of prostate: Secondary | ICD-10-CM | POA: Diagnosis not present

## 2023-03-27 DIAGNOSIS — Z8249 Family history of ischemic heart disease and other diseases of the circulatory system: Secondary | ICD-10-CM | POA: Diagnosis not present

## 2023-04-11 DIAGNOSIS — R739 Hyperglycemia, unspecified: Secondary | ICD-10-CM | POA: Diagnosis not present

## 2023-04-21 DIAGNOSIS — L814 Other melanin hyperpigmentation: Secondary | ICD-10-CM | POA: Diagnosis not present

## 2023-04-21 DIAGNOSIS — Z808 Family history of malignant neoplasm of other organs or systems: Secondary | ICD-10-CM | POA: Diagnosis not present

## 2023-04-21 DIAGNOSIS — D225 Melanocytic nevi of trunk: Secondary | ICD-10-CM | POA: Diagnosis not present

## 2023-04-21 DIAGNOSIS — L578 Other skin changes due to chronic exposure to nonionizing radiation: Secondary | ICD-10-CM | POA: Diagnosis not present

## 2023-04-21 DIAGNOSIS — L570 Actinic keratosis: Secondary | ICD-10-CM | POA: Diagnosis not present

## 2023-04-21 DIAGNOSIS — D2271 Melanocytic nevi of right lower limb, including hip: Secondary | ICD-10-CM | POA: Diagnosis not present

## 2023-04-21 DIAGNOSIS — Z86006 Personal history of melanoma in-situ: Secondary | ICD-10-CM | POA: Diagnosis not present

## 2023-04-21 DIAGNOSIS — Z85828 Personal history of other malignant neoplasm of skin: Secondary | ICD-10-CM | POA: Diagnosis not present

## 2023-04-21 DIAGNOSIS — L821 Other seborrheic keratosis: Secondary | ICD-10-CM | POA: Diagnosis not present

## 2023-04-29 DIAGNOSIS — R748 Abnormal levels of other serum enzymes: Secondary | ICD-10-CM | POA: Diagnosis not present

## 2023-10-21 DIAGNOSIS — L570 Actinic keratosis: Secondary | ICD-10-CM | POA: Diagnosis not present

## 2023-10-21 DIAGNOSIS — Z808 Family history of malignant neoplasm of other organs or systems: Secondary | ICD-10-CM | POA: Diagnosis not present

## 2023-10-21 DIAGNOSIS — B359 Dermatophytosis, unspecified: Secondary | ICD-10-CM | POA: Diagnosis not present

## 2023-10-21 DIAGNOSIS — Z86006 Personal history of melanoma in-situ: Secondary | ICD-10-CM | POA: Diagnosis not present

## 2023-10-21 DIAGNOSIS — Z85828 Personal history of other malignant neoplasm of skin: Secondary | ICD-10-CM | POA: Diagnosis not present

## 2023-10-21 DIAGNOSIS — L814 Other melanin hyperpigmentation: Secondary | ICD-10-CM | POA: Diagnosis not present

## 2023-10-21 DIAGNOSIS — L578 Other skin changes due to chronic exposure to nonionizing radiation: Secondary | ICD-10-CM | POA: Diagnosis not present

## 2023-10-21 DIAGNOSIS — L821 Other seborrheic keratosis: Secondary | ICD-10-CM | POA: Diagnosis not present

## 2023-10-21 DIAGNOSIS — D2271 Melanocytic nevi of right lower limb, including hip: Secondary | ICD-10-CM | POA: Diagnosis not present

## 2023-10-21 DIAGNOSIS — D225 Melanocytic nevi of trunk: Secondary | ICD-10-CM | POA: Diagnosis not present

## 2023-11-10 ENCOUNTER — Encounter: Payer: Self-pay | Admitting: Radiology
